# Patient Record
Sex: Male | Born: 1955 | Race: White | Hispanic: No | Marital: Single | Smoking: Former smoker
Health system: Southern US, Community
[De-identification: ages and names within clinical notes are randomized; demographics above are authoritative.]

## PROBLEM LIST (undated history)

## (undated) DIAGNOSIS — I2510 Atherosclerotic heart disease of native coronary artery without angina pectoris: Secondary | ICD-10-CM

## (undated) DIAGNOSIS — J449 Chronic obstructive pulmonary disease, unspecified: Secondary | ICD-10-CM

## (undated) DIAGNOSIS — J45909 Unspecified asthma, uncomplicated: Secondary | ICD-10-CM

## (undated) DIAGNOSIS — M1990 Unspecified osteoarthritis, unspecified site: Secondary | ICD-10-CM

## (undated) DIAGNOSIS — E785 Hyperlipidemia, unspecified: Secondary | ICD-10-CM

## (undated) DIAGNOSIS — G4733 Obstructive sleep apnea (adult) (pediatric): Secondary | ICD-10-CM

## (undated) DIAGNOSIS — G4730 Sleep apnea, unspecified: Secondary | ICD-10-CM

## (undated) DIAGNOSIS — I10 Essential (primary) hypertension: Secondary | ICD-10-CM

## (undated) DIAGNOSIS — F32A Depression, unspecified: Secondary | ICD-10-CM

## (undated) DIAGNOSIS — M10322 Gout due to renal impairment, left elbow: Secondary | ICD-10-CM

## (undated) HISTORY — DX: Chronic obstructive pulmonary disease, unspecified: J44.9

## (undated) HISTORY — DX: Sleep apnea, unspecified: G47.30

## (undated) HISTORY — DX: Essential (primary) hypertension: I10

## (undated) HISTORY — DX: Obstructive sleep apnea (adult) (pediatric): G47.33

## (undated) HISTORY — DX: Atherosclerotic heart disease of native coronary artery without angina pectoris: I25.10

## (undated) HISTORY — PX: NECK SURGERY: SHX720

## (undated) HISTORY — DX: Hyperlipidemia, unspecified: E78.5

## (undated) HISTORY — DX: Unspecified osteoarthritis, unspecified site: M19.90

## (undated) HISTORY — PX: SPINE SURGERY: SHX786

## (undated) HISTORY — DX: Morbid (severe) obesity due to excess calories: E66.01

---

## 2006-03-07 ENCOUNTER — Ambulatory Visit: Payer: Self-pay | Admitting: Cardiovascular Disease

## 2006-03-26 ENCOUNTER — Encounter: Admission: RE | Admit: 2006-03-26 | Discharge: 2006-03-26 | Payer: Self-pay | Admitting: Family Medicine

## 2006-04-13 ENCOUNTER — Ambulatory Visit: Payer: Self-pay | Admitting: Gastroenterology

## 2006-04-22 ENCOUNTER — Ambulatory Visit (HOSPITAL_COMMUNITY): Admission: RE | Admit: 2006-04-22 | Discharge: 2006-04-23 | Payer: Self-pay | Admitting: Neurosurgery

## 2006-09-07 ENCOUNTER — Encounter: Admission: RE | Admit: 2006-09-07 | Discharge: 2006-09-07 | Payer: Self-pay | Admitting: Family Medicine

## 2006-11-01 ENCOUNTER — Ambulatory Visit (HOSPITAL_COMMUNITY): Admission: RE | Admit: 2006-11-01 | Discharge: 2006-11-01 | Payer: Self-pay | Admitting: Neurosurgery

## 2006-11-16 ENCOUNTER — Inpatient Hospital Stay (HOSPITAL_COMMUNITY): Admission: RE | Admit: 2006-11-16 | Discharge: 2006-11-18 | Payer: Self-pay | Admitting: Neurosurgery

## 2007-03-26 ENCOUNTER — Encounter: Admission: RE | Admit: 2007-03-26 | Discharge: 2007-03-26 | Payer: Self-pay | Admitting: Neurosurgery

## 2007-10-12 ENCOUNTER — Emergency Department: Payer: Self-pay | Admitting: Unknown Physician Specialty

## 2008-02-09 ENCOUNTER — Ambulatory Visit: Payer: Self-pay | Admitting: Cardiovascular Disease

## 2008-02-12 ENCOUNTER — Encounter
Admission: RE | Admit: 2008-02-12 | Discharge: 2008-05-12 | Payer: Self-pay | Admitting: Physical Medicine & Rehabilitation

## 2008-02-13 ENCOUNTER — Ambulatory Visit: Payer: Self-pay | Admitting: Physical Medicine & Rehabilitation

## 2008-03-12 ENCOUNTER — Ambulatory Visit: Payer: Self-pay | Admitting: Physical Medicine & Rehabilitation

## 2008-04-04 ENCOUNTER — Ambulatory Visit: Payer: Self-pay | Admitting: Physical Medicine & Rehabilitation

## 2008-04-09 ENCOUNTER — Ambulatory Visit: Payer: Self-pay

## 2008-04-29 IMAGING — RF DG FLUORO RM 1-60 MIN
3 series · 3 of 3 positions shown · non-contrast
Comparison: MRI of 09/07/06.

CLINICAL DATA: Neck pain. 
FLUORO ROOM FOR LUMBAR PUNCTURE AND INJECTION:
TECHNIQUE: Multidetector CT imaging of the cervical spine was performed after intrathecal injection of contrast.  Multiplanar CT image reconstructions were also generated.
TECHNIQUE: Multidetector CT imaging of the lumbar spine was performed after intrathecal injection of contrast.  Multiplanar CT image reconstructions were also generated.

[Series 1: run · 1 of 1 slices shown (1 of 3)]
[im 1/1]
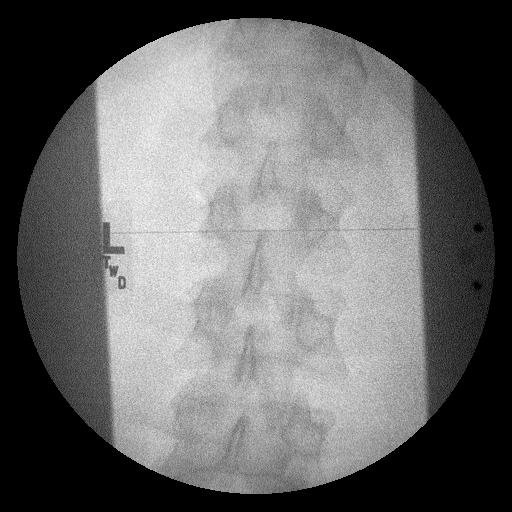

[Series 2: run · 1 of 1 slices shown (2 of 3)]
[im 1/1]
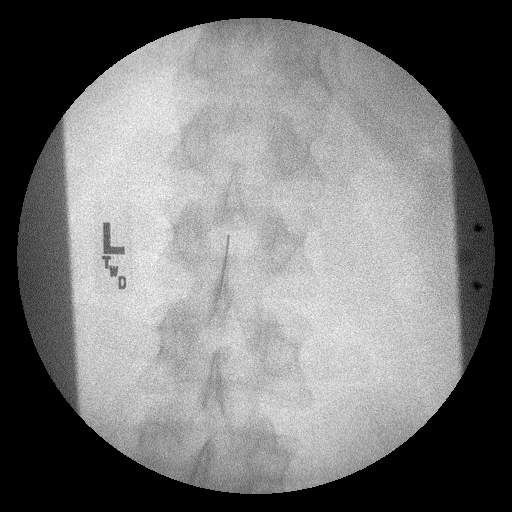

[Series 3: run · 1 of 1 slices shown (3 of 3)]
[im 1/1]
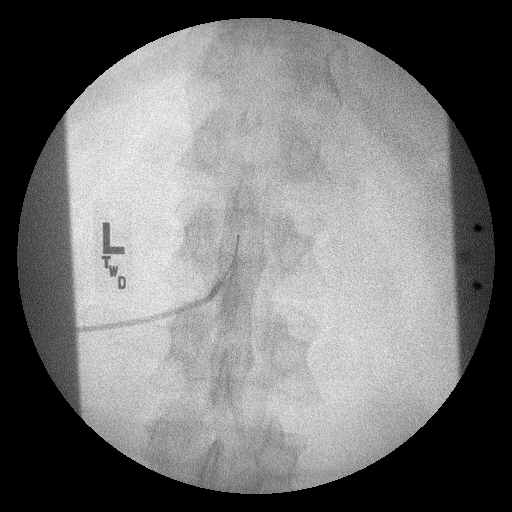

[3 of 3 positions shown; findings below may reference images not displayed]

FINDINGS: Lumbar puncture and injection of contrast was performed by Dr. Nezasitan.  Lumbar puncture was difficult.  AP and lateral views of the lumbar spine reveal a needle overlying the spinal canal at the L3-4 level.   There is a small amount of subarachnoid contrast noted in the lumbar canal.  The patient was over the weight limit for the table, therefore no further imaging was performed.
IMPRESSION: Subarachnoid injection of contrast for myelography.  No diagnostic myelographic images were obtained. 
CT CERVICAL SPINE WITH CONTRAST (POST-MYELOGRAM):
FINDINGS: There has been anterior plate and interbody fusion of C4-5 and C5-6, which appears to satisfactory in position and alignment.  The cervical alignment is normal. 
C2-3:  Small to medium central disk protrusion without cord deformity.
C3-4:  Small to moderate central disk protrusion with mild flattening of the ventral cord.  There is mild spinal stenosis with the canal measuring 9 mm in diameter.  Mild facet arthropathy is noted without significant foraminal narrowing. 
C4-5:  Anterior plate and screws are in good position.  There is a residual posterior osteophyte with some associated soft tissue density, which may be fibrosis in the midline.  The canal is narrowed to 8.6 mm.  There is flattening of the cord. 
C5-6:  Anterior plate and screws are in good position.  There is a moderate amount of posterior osteophyte above and below the disk space causing spinal stenosis.  The cord is compressed, as noted on the prior MRI.  The prior MRI revealed hyperintensity in the cord at this level due to myelomalacia.  There may also be some epidural fibrosis impinging on the cord. The canal measures approximately 6.5 mm in diameter.  Mild left foraminal narrowing due to spurring. 
C6-7:  Small central osteophyte is seen without cord deformity. 
C7-T1:  Negative. 
Image quality is degraded due to patient size.
IMPRESSION: 1.  Anterior plate and interbody fusion at C4-5 and C5-6.  There remains spinal stenosis at these levels, especially at C5-6, due to posterior osteophytes and some soft tissue thickening, felt to be fibrosis.  There is some cord compression at C5-6 and myelomalacia is noted in the cord on the prior MRI. 
2.  Small to moderate central disk protrusion at C2-3 and C3-4 contribute to mild spinal stenosis at these levels. 
CT LUMBAR SPINE WITH CONTRAST (POST-MYELOGRAM):
FINDINGS: Image quality is significantly degraded by obesity.  The images are very grainy. 
There is mild anterior slip of L4 on L5 of approximately 5 mm.  No fracture or mass is seen.  The conus medullaris is normal. 
T12-L1:  Mild disk degeneration. 
L1-2:  Mild disk bulging and mild facet and ligamentum flavum hypertrophy.  There is mild central canal stenosis. 
L2-3:  Mild disk bulging and mild facet and ligamentum flavum hypertrophy.  There is mild to moderate central canal stenosis. 
L3-4:  There is diffuse bulging of the disk.  Facet and ligamentum flavum hypertrophy are present.  There is moderate central canal stenosis. 
L4-5:  There are 5 mm of anterior slip.  There is moderate to advanced facet arthropathy.  There is disk bulging.  There is moderate to severe central canal stenosis.  There is mild foraminal narrowing bilaterally. 
L5-S1:  Mild disk bulging and mild facet arthropathy.
IMPRESSION: 1.  Image quality is degraded by patient size. 
2.  The patient has a relative congenital spinal stenosis in the lumbar spine.  In addition, multiple levels of spinal stenosis are present.  Spinal stenosis is most severe at L3-4 and L4-5.  There is grade I anterior slip of L4 on L5 due to facet and disk degeneration.

## 2008-05-02 ENCOUNTER — Encounter
Admission: RE | Admit: 2008-05-02 | Discharge: 2008-05-03 | Payer: Self-pay | Admitting: Physical Medicine & Rehabilitation

## 2008-05-03 ENCOUNTER — Ambulatory Visit: Payer: Self-pay | Admitting: Physical Medicine & Rehabilitation

## 2008-05-14 IMAGING — RF DG CERVICAL SPINE 1V
1 series · 1 of 1 positions shown · non-contrast
Comparison: none

CLINICAL DATA: Cervical posterior fusion.  
 CERVICAL SPINE ? 1 VIEW:

[Series 1: run · 1 of 1 slices shown]
[im 1/1]
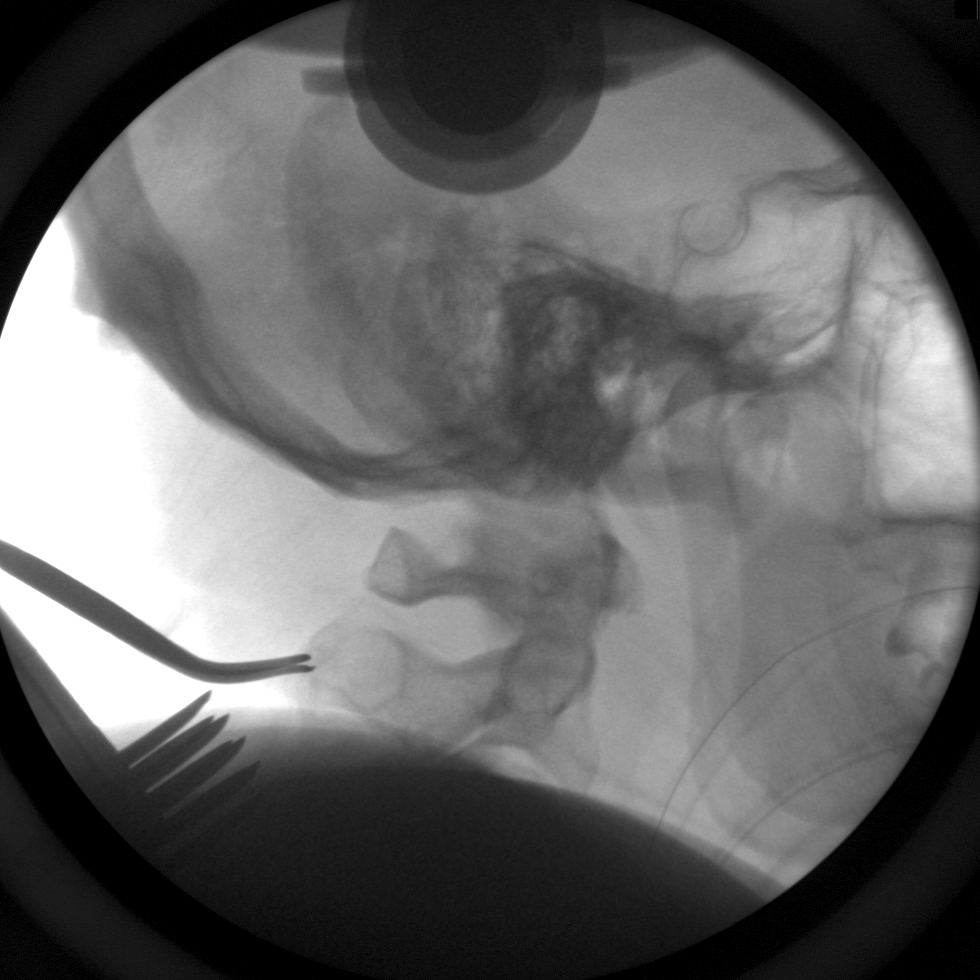

[1 of 1 positions shown; findings below may reference images not displayed]

FINDINGS: A single intraoperative fluoroscopic spot view of the upper cervical spine is submitted.  Surgical probe tip projects posterior to the C2 spinous process.
IMPRESSION: Please see above.

## 2008-08-02 ENCOUNTER — Encounter
Admission: RE | Admit: 2008-08-02 | Discharge: 2008-08-15 | Payer: Self-pay | Admitting: Physical Medicine & Rehabilitation

## 2008-08-15 ENCOUNTER — Ambulatory Visit: Payer: Self-pay | Admitting: Physical Medicine & Rehabilitation

## 2008-08-19 ENCOUNTER — Ambulatory Visit: Payer: Self-pay | Admitting: Cardiovascular Disease

## 2008-10-22 ENCOUNTER — Ambulatory Visit: Payer: Self-pay | Admitting: Physical Medicine & Rehabilitation

## 2008-10-22 ENCOUNTER — Encounter
Admission: RE | Admit: 2008-10-22 | Discharge: 2008-12-31 | Payer: Self-pay | Admitting: Physical Medicine & Rehabilitation

## 2008-10-31 ENCOUNTER — Ambulatory Visit: Payer: Self-pay | Admitting: Physical Medicine & Rehabilitation

## 2008-12-31 ENCOUNTER — Ambulatory Visit: Payer: Self-pay | Admitting: Physical Medicine & Rehabilitation

## 2009-02-20 ENCOUNTER — Ambulatory Visit: Payer: Self-pay | Admitting: Physical Medicine & Rehabilitation

## 2009-02-20 ENCOUNTER — Encounter
Admission: RE | Admit: 2009-02-20 | Discharge: 2009-02-20 | Payer: Self-pay | Admitting: Physical Medicine & Rehabilitation

## 2009-04-09 DIAGNOSIS — I10 Essential (primary) hypertension: Secondary | ICD-10-CM | POA: Insufficient documentation

## 2009-04-09 DIAGNOSIS — E785 Hyperlipidemia, unspecified: Secondary | ICD-10-CM | POA: Insufficient documentation

## 2009-04-09 DIAGNOSIS — I2510 Atherosclerotic heart disease of native coronary artery without angina pectoris: Secondary | ICD-10-CM | POA: Insufficient documentation

## 2009-04-14 ENCOUNTER — Ambulatory Visit: Payer: Medicare Other | Admitting: Gastroenterology

## 2009-05-09 ENCOUNTER — Encounter
Admission: RE | Admit: 2009-05-09 | Discharge: 2009-06-11 | Payer: Self-pay | Admitting: Physical Medicine & Rehabilitation

## 2009-05-12 ENCOUNTER — Ambulatory Visit: Payer: Self-pay | Admitting: Physical Medicine & Rehabilitation

## 2009-05-19 ENCOUNTER — Ambulatory Visit: Payer: Self-pay | Admitting: Physical Medicine & Rehabilitation

## 2009-07-18 ENCOUNTER — Encounter
Admission: RE | Admit: 2009-07-18 | Discharge: 2009-07-21 | Payer: Self-pay | Admitting: Physical Medicine & Rehabilitation

## 2009-07-21 ENCOUNTER — Ambulatory Visit: Payer: Self-pay | Admitting: Physical Medicine & Rehabilitation

## 2009-10-14 ENCOUNTER — Encounter
Admission: RE | Admit: 2009-10-14 | Discharge: 2009-10-16 | Payer: Self-pay | Admitting: Physical Medicine & Rehabilitation

## 2009-10-16 ENCOUNTER — Ambulatory Visit: Payer: Self-pay | Admitting: Physical Medicine & Rehabilitation

## 2009-12-31 ENCOUNTER — Encounter
Admission: RE | Admit: 2009-12-31 | Discharge: 2010-01-08 | Payer: Self-pay | Admitting: Physical Medicine & Rehabilitation

## 2010-01-08 ENCOUNTER — Ambulatory Visit: Payer: Self-pay | Admitting: Physical Medicine & Rehabilitation

## 2010-04-03 ENCOUNTER — Encounter
Admission: RE | Admit: 2010-04-03 | Discharge: 2010-07-02 | Payer: Self-pay | Source: Home / Self Care | Attending: Physical Medicine & Rehabilitation | Admitting: Physical Medicine & Rehabilitation

## 2010-04-09 ENCOUNTER — Ambulatory Visit: Payer: Self-pay | Admitting: Physical Medicine & Rehabilitation

## 2010-07-02 ENCOUNTER — Encounter
Admission: RE | Admit: 2010-07-02 | Discharge: 2010-07-21 | Payer: Self-pay | Source: Home / Self Care | Attending: Physical Medicine & Rehabilitation | Admitting: Physical Medicine & Rehabilitation

## 2010-07-06 ENCOUNTER — Ambulatory Visit: Admit: 2010-07-06 | Payer: Self-pay | Admitting: Physical Medicine & Rehabilitation

## 2010-07-12 ENCOUNTER — Encounter: Payer: Self-pay | Admitting: Neurosurgery

## 2010-07-23 ENCOUNTER — Ambulatory Visit: Payer: Medicare Other

## 2010-07-23 ENCOUNTER — Ambulatory Visit: Admit: 2010-07-23 | Payer: Self-pay | Admitting: Physical Medicine & Rehabilitation

## 2010-07-23 ENCOUNTER — Encounter: Payer: Medicare Other | Admitting: Physical Medicine & Rehabilitation

## 2010-07-23 ENCOUNTER — Encounter: Payer: Medicare Other | Attending: Physical Medicine & Rehabilitation

## 2010-07-23 DIAGNOSIS — M545 Low back pain, unspecified: Secondary | ICD-10-CM | POA: Insufficient documentation

## 2010-07-23 DIAGNOSIS — IMO0002 Reserved for concepts with insufficient information to code with codable children: Secondary | ICD-10-CM | POA: Insufficient documentation

## 2010-10-26 ENCOUNTER — Encounter: Payer: Medicare Other | Attending: Physical Medicine & Rehabilitation

## 2010-10-26 ENCOUNTER — Encounter: Payer: Medicare Other | Admitting: Physical Medicine & Rehabilitation

## 2010-10-26 DIAGNOSIS — IMO0002 Reserved for concepts with insufficient information to code with codable children: Secondary | ICD-10-CM | POA: Insufficient documentation

## 2010-10-26 DIAGNOSIS — M545 Low back pain, unspecified: Secondary | ICD-10-CM | POA: Insufficient documentation

## 2010-10-26 NOTE — Procedures (Signed)
NAMEVALLIE, TETERS                 ACCOUNT NO.:  [id number]  MEDICAL RECORD NO.:  [id number]           PATIENT TYPE:  O  LOCATION:  [facility]                          FACILITY:  [facility]  PHYSICIAN:  Erick Colace, M.D.DATE OF BIRTH:  23-Jun-1955  DATE OF PROCEDURE: DATE OF DISCHARGE:                              OPERATIVE REPORT  PROCEDURE:  This is a left L3-4 translaminar lumbar epidural steroid injection under fluoroscopic guidance.  INDICATION:  Lumbar stenosis with radiculitis.  Pain is only partially responsive to medication management, other conservative care has received about 3 months on average with each epidural steroid injection performed, last one performed July 23, 2010.  Pain is limiting function, Oswestry score of 66%.  Numeric rating scale is 7-8/10 pain.  Informed consent was obtained after describing risks and benefits of the procedure with the patient.  These include bleeding, bruising, and infection, he elects proceed and has given written consent.  The patient placed prone on fluoroscopy table with Betadine prep, sterile drape, 25- gauge inch and half needle was used to anesthetize the skin and subcu tissue with 1% lidocaine x2 mL.  Then a 25-gauge inch and half needle was used to anesthetize skin and subcu tissue with 1.5 mL of 1% lidocaine followed by insertion of 17-gauge 8-cm spinal needle with stylet in place.  Once needle tip approximated posterior elements on lateral imaging, loss-of-resistance technique was used with 50:50 air saline mix.  Positive loss-of-resistance was confirmed with Omnipaque 180 x 3 mL demonstrated good epidural spread followed by injection of 2 mL of 40 mg/mL of Depo-Medrol and 2 mL of 1% MPF lidocaine.  The patient tolerated procedure well.  Postprocedure instructions given.  Return in 3 months, possible reinjection at that time.     Erick Colace, M.D. Electronically Signed    AEK/MEDQ  D:  10/26/2010 10:01:16  T:   10/26/2010 22:13:15  Job:  [id number]

## 2010-11-03 NOTE — Assessment & Plan Note (Signed)
Gary Trevino returns today.  He had missed an appointment couple of weeks  ago.  This is a 55 year old male with lumbosacral radiculitis.  His past  history of cervical fusion and chronic low back pain.  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  He has been  relatively well maintained on tramadol 100 mg t.i.d.  He has not had any  improvement on higher dose of Effexor on the 3 tablets per day, compared  with 2 tablets per day.  He has had no evidence of serotonin syndrome.   My concern today is some burning pain in the hands.  He has a chronic  ulnar neuropathy as well as cervical post laminectomy syndrome.   CURRENT MEDICATIONS:  1. Tramadol 100 mg t.i.d.  2. Effexor 37.5 t.i.d., but we will plan on reducing back on b.i.d.   REVIEW OF SYSTEMS:  Numbness, tremor, tingling, trouble walking, spasms,  depression, confusion, anxiety, constipation.   PAST MEDICAL HISTORY:  Hypertension.   SOCIAL HISTORY:  Single.  Lives alone.   PHYSICAL EXAMINATION:  Blood pressure 113/62, pulse 66, respirations 18,  O2 sat 96% on room air.  Obese male in no acute stress.  Orientation x3.  Affect is alert.  Gait is normal and his upper and lower extremity  strength is normal with exception.  He does not have a good grip with  the medial 3 fingers of his left hand.   Sensation, reduced in the bilateral ulnar distribution.   The upper and lower extremity range of motion is only mildly reduced at  the end range in the hips internal-external rotation.   IMPRESSION:  1. Lumbar spinal stenosis.  2. Cervical stenosis.  3. Question ulnar neuropathy versus C8 radiculitis.   PLAN:  1. We will continue tramadol 100 t.i.d.  2. We will reduce Effexor to 37.5 b.i.d.  3. Add gabapentin 100 mg nightly x3 days, work up b.i.d. x3 days,      t.i.d. x3 days, then q.i.d.   I will see him back in couple of months.  If his upper extremity  symptomatology progressive despite his  gabapentin, we will increase the  gabapentin and do nerve study.      Gary Trevino, M.D.  Electronically Signed    AEK/MedQ  D:  08/15/2008 16:09:17  T:  08/16/2008 03:54:02  Job #:  [id number]

## 2010-11-03 NOTE — Procedures (Signed)
Gary Trevino, SHANKAR                 ACCOUNT NO.:  [id number]   MEDICAL RECORD NO.:  [id number]         PATIENT TYPE:  aecp   LOCATION:                                 FACILITY:   PHYSICIAN:  Erick Colace, M.D.DATE OF BIRTH:  [birth year]   DATE OF PROCEDURE:  DATE OF DISCHARGE:                               OPERATIVE REPORT   INDICATION:  Lumbosacral radiculitis.   PROCEDURE:  L3-4 translaminar lumbar epidural steroid injection under  fluoroscopic guidance pain is only partially responsive to medication  management and other conservative care and interferes with self-care and  mobility.   Informed consent was obtained after describing risks and benefits of the  procedure with the patient.  These include bleeding, bruising,  infection.  He elects to proceed and has given written consent.  The  patient was placed prone on fluoroscopy table.  Betadine prep, sterile  drape.  A 25-gauge 1-1/2 inch needle was used to anesthetize skin and  subcu tissue, 1% lidocaine x2 mL, then a 22-gauge 3-1/2 inch spinal  needle was inserted under fluoroscopic guidance targeting the L3-4  interlaminar space.  AP, lateral, and oblique images utilized.  Omnipaque 180 under live fluoro demonstrated good epidural spread  followed by injection of 2 mL of 40 mg/mL Depo-Medrol and 2 mL of 1% MPF  lidocaine.  The patient tolerated the procedure well.  Pre and post  injection vitals stable.  He has been off his Plavix for 5 days.      Erick Colace, M.D.  Electronically Signed     AEK/MEDQ  D:  04/04/2008 13:46:11  T:  04/05/2008 02:01:15  Job:  [id number]

## 2010-11-03 NOTE — Op Note (Signed)
Gary Trevino, Gary Trevino                 ACCOUNT NO.:  [id number]   MEDICAL RECORD NO.:  [id number]          PATIENT TYPE:  INP   LOCATION:  5016                         FACILITY:  [facility]   PHYSICIAN:  Coletta Memos, M.D.     DATE OF BIRTH:  1955/08/10   DATE OF PROCEDURE:  11/16/2006  DATE OF DISCHARGE:                               OPERATIVE REPORT   PREOPERATIVE DIAGNOSIS:  1. Cervical stenosis C4 to C6.  2. Cervical myelopathy.  3. Possible pseudoarthrosis C4 to C6.   POSTOPERATIVE DIAGNOSES:  Cervical stenosis with myelopathy C3 to C6   PROCEDURE:  1. Posterior cervical decompression C4 to C6 via laminectomy.  2. Posterior arthrodesis C4 to C6 with morselized autograft, same      incision.  3. Posterior instrumentation C3 to C6 with Vertex lateral mass screws      connected by rods.   FINDINGS:  No pseudoarthrosis identified.   INDICATIONS FOR PROCEDURE:  Gary Trevino is a gentleman whom I fused  approximately six months ago and did an anterior cervical decompression  at C4-C5 and at C5-C6 secondary to abnormal cord signal and evidence of  myelopathy on exam.  Postoperatively, he improved, but remained with  residual pain in the neck and some in his right arm and hand.  I had an  MRI performed that showed that he still did have compression of the  spinal cord at the level of the previous fusions.  I also did a CT after  a myelogram and it was not clear at all whether he had formed a solid  fusion in the six months.  PEEK interbodies had been used previously.  I, therefore, recommended to him secondary to the fact that he still has  some compression, that I do a posterior decompression and since there  was question of possible pseudoarthrosis to also place posterior  instrumentation.  He agreed and is admitted today for that operation.   SURGEON:  Coletta Memos, M.D.   ASSISTANT:  Danae Orleans. Venetia Maxon, M.D.   ANESTHESIA:  General endotracheal.   OPERATIVE NOTE:  [name] was  brought to the operating room, intubated,  and placed under general anesthetic without difficulty.  He had a Foley  catheter placed under sterile conditions.  A Mayfield head holder was  placed at approximately 60 pounds of pressure.  He was then rolled prone  on the operating table with body rolls so that all pressure points were  properly padded.  He was connected to the bed via the Mayfield adapter.  His neck was prepped and he was draped in a sterile fashion.  I used 10  mL of 0.5% lidocaine with 1:200,000 epinephrine and I infiltrated that  into the cervical region posteriorly.  I opened the skin with a #10  blade.  I took this down through the subcutaneous fat.  I placed a self  retaining retractor.  Then, using monopolar cautery, continued to  dissect to the posterior cervical fascia.  I was then able to expose the  lamina of two vertebra.  Fluoroscopy showed that I could not  see where I  was at that point in time.  So I then opened rostrally to this level  until I was able to place a towel clip on C2.  I was able to clearly see  that on a fluoroscopic image.  I then counted down from C2 and  identified C4, C5 and C6 lamina.  I then exposed the lateral masses  bilaterally.   With Dr. Fredrich Birks help, we then placed posterior instrumentation from C4  to C6.  Each hole was first determined by using an awl in the lateral  mass of C4, C5 and C6.  Then, using a power drill, 12 mm length 3.5 mm  in diameter, a hole was drilled into the lateral mass 30 degrees and 30  degrees up in each instance.  We stayed out the contralateral side.  Screws were placed in the lateral mass of C4, C5 and C6 bilaterally.  After placing the screws, I then turned my attention to the  decompression.   The C4, C5 and C6 lamina were removed using Leksell rongeur and Kerrison  punches thin foot plated.  This was done to fully decompress the spinal  cord from C4 to C6.  Ligamentum flavum was also removed.   I  then turned my attention to the posterior arthrodesis.  The facet  joints between C4 and C5, C5 and C6, were decorticated bilaterally using  a high speed drill and curets.  I then placed morselized autograft taken  from the laminectomy and placed that across the facets at C4 and C5 and  at C5 and C6 bilaterally.  I then prepared to close the wound.   I closed the wound in a layered fashion using Vicryl sutures.  A 3-0  nylon suture was used to reapproximate the skin edges.  A sterile  dressing was applied.  I then removed the patient from the Mayfield  adapter and we turned him supine onto the bed.  He was then extubated  moving all extremities.           ______________________________  Coletta Memos, M.D.     KC/MEDQ  D:  11/16/2006  T:  11/16/2006  Job:  [id number]

## 2010-11-03 NOTE — Assessment & Plan Note (Signed)
A 55 year old male with a history of lumbosacral radiculitis.  He has  past history of cervical fusion and chronic low back pain.  He has a  history of alcohol abuse with prior DUI and severe COPD.  I saw him on  initial consultation on January 17, 2008.  He has been relatively well  maintained on tramadol 100 mg t.i.d. and switched him from his  benzodiazepine to Effexor 37.5 b.i.d.  He has had no evidence of  serotonin syndrome.  He feels like the Effexor has been helping somewhat  with the anxiety, but feels like he can use a little bit more of that.   He has had some relief of his low back and lower extremity pain with the  L3-4 translaminar lumbar epidural steroid injection, confirmed April 04, 2008.  He has continued relief.  His average pain is 5/10.  Sleep is  poor.  He can walk 30 minutes at a time.  He does not climb steps.  He  does not drive.  He needs assistance with certain household duties as  well as shopping, meal preparation bathing and dressing.  He has  numbness and tingling in the feet as well as confusion, depression, and  anxiety, but negative for suicidal thoughts.  He does have some  constipation.  His Oswestry disability index score is 42%, which is  somewhat improved compared to 50% on March 12, 2008.   CURRENT MEDICATIONS:  1. Tramadol 100 mg t.i.d.  2. Effexor 37.5 b.i.d.   PHYSICAL EXAMINATION:  VITAL SIGNS:  Blood pressure 113/69, pulse 72,  respiratory rate 18, and O2 saturation is 94% on room air.  GENERAL:  In no acute distress. Orientation x3.  Affect is alert.  Gait  is normal.  Coordination is normal.  Upper and lower extremities, deep  tendon reflexes are 2+ bilateral ankles, intact bilateral knees.  He has  full strength in upper and lower extremities.  Extremity show no  evidence of edema.   IMPRESSION:  1. Lumbar radiculitis improved after lumbar epidural steroid      injection.  2. Lumbar spondylosis with spinal stenosis as well as  cervical post-      laminectomy syndrome.  He is doing relatively well on the current      medications.  We will bump up his Effexor to 37.5 one in the      morning and 2 in the evening.  Discussed risk of serotonin      syndrome.  He understands the signs and symptoms and will report      them to [place] should he start experiencing with increased dose of the      Effexor in combination with the tramadol.      Erick Colace, M.D.  Electronically Signed     AEK/MedQ  D:  05/03/2008 16:08:25  T:  05/04/2008 03:33:52  Job #:  [id number]   cc:   Galen Daft. Timoteo Gaul, M.D.

## 2010-11-03 NOTE — Procedures (Signed)
NAMEMESHACH, Gary Trevino                 ACCOUNT NO.:  [id number]   MEDICAL RECORD NO.:  [id number]          PATIENT TYPE:  REC   LOCATION:  [facility]                          FACILITY:  [facility]   PHYSICIAN:  Erick Colace, M.D.DATE OF BIRTH:  1955-07-20   DATE OF PROCEDURE:  02/20/2009  DATE OF DISCHARGE:  12/31/2008                               OPERATIVE REPORT   PROCEDURE:  This is a L3-4 translaminar lumbar epidural steroid  injection under fluoroscopic guidance.   INDICATIONS:  Lumbar radiculitis and pain interferes with activity and  persists despite conservative care.  He has had good results with prior  epidural injections, last performed in May 2010 with duration of effect  about 3 months.   Informed consent was obtained after describing risks and benefits of the  procedure with the patient.  These include bleeding, bruising, and  infection.  He elects to proceed and has given written consent.   The patient was placed prone on fluoroscopy table.  Betadine prep,  sterile drape, 25-gauge 1-1/2-inch needle was used to anesthetize the  skin and subcutaneous tissue, 1% lidocaine x2 mL.  Then, 18-gauge Tuohy  needle was inserted under fluoroscopic guidance into the left L3-4  interlaminar space.  AP and lateral oblique images were utilized, loss-  of-resistance technique utilized with 50:50 air-saline mix advancing  under lateral views.  Then, Omnipaque 180 under live fluoro x 2 mL  demonstrated good epidural spread followed by injection of 2 mL of 40  mg/mL Depo-Medrol and 2 mL of 1% lidocaine.  The patient tolerated the  procedure well.  Pre- and post-injection vitals stable.  Post injection  instructions given.  I will see him back in about 3 months for  reinjection.      Erick Colace, M.D.  Electronically Signed     AEK/MEDQ  D:  02/20/2009 10:19:19  T:  02/20/2009 23:27:16  Job:  [id number]
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
## 2010-11-03 NOTE — Assessment & Plan Note (Signed)
[facility] OFFICE NOTE   Gary Trevino, Gary Trevino                        MRN:          [id number]  DATE:08/19/2008                            DOB:          02-19-1956    PRIMARY CARE PHYSICIAN:  Dr. Gelene Mink at [facility].   HISTORY OF PRESENT ILLNESS:  [name] is a pleasant 55 year old  morbidly obese Caucasian male with past medical history significant for  nonobstructive coronary artery disease, hypertension, hyperlipidemia,  obstructive sleep apnea, COPD, and history of tobacco abuse, who returns  today for routine followup appointment.  [name] was seen initially in  our office on February 09, 2008, with complaints of shortness of breath  and for further evaluation after an abnormal EKG and abnormal  echocardiogram in his primary care physician's office.  His  echocardiogram was read by Dr. Kirke Corin on January 08, 2008, that showed  mildly dilated left atrium with other chambers being normal in size.  The left ventricular systolic function was noted to be normal.  There  was mild-to-moderate left ventricular hypertrophy.  There was trace  tricuspid regurgitation, but no other significant valvular abnormalities  were noted.  Ejection fraction was estimated at 59%.  His EKG during the  visit in our office showed sinus bradycardia, but no significant  changes.   The patient returns today for routine cardiac visit.  He had complained  of some lower extremity pain with ambulation at the last visit.  We  performed lower extremity arterial Dopplers that showed no evidence of  occlusive arterial disease in the bilateral lower extremities.  The  patient tells me that he has had 2 episodes of mild throbbing-type  sensation in his left chest that lasts for a few minutes.  This has  occurred twice over the last 6 months.  There are no associated symptoms  and no radiation of the pain.  He tells me that he has  been active  around his house and was chopping wood prior to his appointment this  morning.  He denies having any heaviness in his chest or any episodes of  dizziness, near syncope, or syncope.  He occasionally has some shortness  of breath which he feels is most likely related to his COPD.  The  shortness of breath generally resolves when he uses his inhalers.  He  has had no changes in his medications since last visit in our office.   CURRENT MEDICATIONS:  1. Phentermine 37.5 mg every other day.  2. Micardis 40/12.5 mg once daily.  3. Plavix 75 mg once daily.  4. Clotrimazole cream every other day.  5. Nystop 100,000 twice daily.  6. Lipitor 10 mg once daily.  7. Lovaza 1 g 2 tablets once daily.  8. Lyrica 150 mg twice daily.  9. Skelaxin 800 mg at night.  10.The patient also uses p.r.n. inhalers including Proventil, Spiriva,      and Azmacort.  11.Aspirin 81 mg once daily.   REVIEW OF SYSTEMS:  As stated in the history of present illness and is  otherwise negative.   PHYSICAL EXAMINATION:  VITAL SIGNS:  Blood pressure 110/80, pulse 63 and  regular, respirations 12 and unlabored.  GENERAL:  He is a pleasant, overweight Caucasian male in no acute  distress.  He is alert and oriented x3.  NECK:  No JVD.  No carotid bruits noted.  No thyromegaly.  No  lymphadenopathy.  HEENT:  Normal.  SKIN:  Warm and dry.  MUSCULOSKELETAL:  Muscle strength and tone is normal.  PSYCHIATRIC:  Mood and affect is appropriate.  NEUROLOGIC:  No focal neurological deficits.  LUNGS:  Clear to auscultation bilaterally with no wheezes, rhonchi, or  crackles noted.  CARDIOVASCULAR:  Regular rate and rhythm without murmurs, gallops, or  rubs noted.  ABDOMEN:  Obese, soft, bowel sounds are present.  EXTREMITIES:  There is no evidence of lower extremity edema.  Pulses are  2+ in the bilateral dorsalis pedis arteries.  Pulses are difficult to  palpate in the posterior tibial arteries.   DIAGNOSTIC  STUDIES:  1. A 12-lead EKG obtained in our office today shows normal sinus      rhythm with a ventricular rate of 63 beats per minute.  The      corrected QT interval is 427 milliseconds.  The PR interval is 198      milliseconds.  QRS duration is 114 milliseconds.  The axis is      normal.  2. Bilateral lower extremity arterial Dopplers show brisk and      triphasic wave forms in the bilateral common femoral, popliteal,      anterior and posterior tibial arteries.  There is no evidence of      segmental lower extremity arterial disease bilaterally.  The ankle-      brachial index is normal bilaterally.  ABI was 1.1 on the right and      1.2 on the left.   ASSESSMENT AND PLAN:  This is a pleasant 55 year old Caucasian male with  a history of morbid obesity, COPD, obstructive sleep apnea,  nonobstructive coronary artery disease, hypertension, hyperlipidemia,  and heavy tobacco use, not currently smoking, who presents for a routine  cardiac followup.  The patient is known to have mild nonobstructive  coronary artery disease with left heart catheterization performed in  September 2007 showing a 30% stenosis in the mid LAD, 30% stenosis in  the ramus intermedius, 30% stenosis in the proximal right coronary  artery, and a 30% stenosis in the mid right coronary artery.  He is not  currently having any signs or symptoms that is suggestive of congestive  heart failure, angina, or arrhythmias.  I would like to continue his  current medications.  I am not completely sure why the patient has been  on Plavix.  It sounds like this was started after his heart  catheterization.  He is not interested in stopping this medication at  this time.  I will have further discussions about potentially stopping  his Plavix at his next visit in our office.  His echocardiogram did show  mild-to-moderate left ventricular hypertrophy.  Because of this, we will  repeat an echocardiogram in 6 months.  We will see him  back in our  office following the echocardiogram.  The patient tells me that his  cholesterol was followed in the office of Dr. Timoteo Gaul.  I will make no  changes in his Lipitor  at the current time.  I have encouraged the patient to continue to  follow up with Dr. Timoteo Gaul for  all of his primary care needs.     Verne Carrow, MD  Electronically Signed    CM/MedQ  DD: 08/19/2008  DT: 08/20/2008  Job #: [id number]   cc:   Dr. Gelene Mink

## 2010-11-03 NOTE — Procedures (Signed)
NAMECLEDIS, SOHN                 ACCOUNT NO.:  [id number]   MEDICAL RECORD NO.:  [id number]          PATIENT TYPE:  REC   LOCATION:  [facility]                          FACILITY:  [facility]   PHYSICIAN:  Erick Colace, M.D.DATE OF BIRTH:  February 12, 1956   DATE OF PROCEDURE:  10/31/2008  DATE OF DISCHARGE:                               OPERATIVE REPORT   PROCEDURE:  This is a left paramedian L3-4 translaminar lumbar epidural  steroid injection under fluoroscopic guidance.   INDICATIONS:  Lumbar radiculopathy due to lumbar spinal stenosis.   Informed consent was obtained after describing risks and benefits of the  procedure with the patient.  These include bleeding, bruising,  infection, loss of bowel and bladder function, temporary or permanent  paralysis.  He elects to proceed and has given written consent.  His  pain persisted despite medication management, other conservative care,  and interferes with self-care mobility.   The patient placed prone on fluoroscopy table.  Betadine prep, sterile  drape 25-gauge 1-1/2 inch needle was used to anesthetize the skin and  subcutaneous tissue, 1% lidocaine x2 mL.  Then, an 18-gauge Tuohy needle  was inserted under fluoroscopic guidance starting in the L3-4  interlaminar space.  AP and lateral images were utilized, loss-of-  resistance technique with 50% air-saline mixture was utilized.  Positive  loss of resistance obtained, confirmed with Omnipaque 180 x2 mL under  live fluoro.  Then, demonstrating good epidural spread followed by  injection of  2 mL of 40 mg/mL Depo-Medrol and 2 mL of 1% MPF lidocaine.  The patient tolerated the procedure well.  Post injection instructions  given.      Erick Colace, M.D.  Electronically Signed     AEK/MEDQ  D:  10/31/2008 14:23:02  T:  11/01/2008 01:27:42  Job:  [id number]

## 2010-11-03 NOTE — Assessment & Plan Note (Signed)
[name] follows up.  He had a left paramedian L3-4 translaminar lumbar  epidural steroid injection under fluoroscopic guidance, Oct 31, 2008.  He has had good results with this.  He feels like he is may be starting  to get a little bit stiff, but overall has had good result.   He has had no new medical problems in the interval time.   REVIEW OF SYSTEMS:  As per health and history form.  No new findings.  He does have depression, anxiety, but no suicidal thoughts.   His pain level is in the 6-7/10 range.  His sleep is fair.  He lives  alone, divorced.   PHYSICAL EXAMINATION:  VITAL SIGNS:  Blood pressure 125/79, pulse 75,  respiratory rate 18, and O2 sat 96% on room air.  GENERAL:  No acute distress.  Mood and affect appropriate.  His gait is  normal.  His strength is normal in the upper and lower extremities.  Neck range of motion is mildly diminished.  Lumbar spine range of motion  is forward flexion 75%, extension is 25%.   IMPRESSION:  Lumbar spinal stenosis with intermittent radicular  symptoms.  These have improved after his injection.   PLAN:  We will repeat injection two more months.  We will continue  current meds, which include tramadol 2 p.o. q.i.d. as well as Effexor  37.5 b.i.d.  No signs of serotonin syndrome.      Gary Trevino, M.D.  Electronically Signed     AEK/MedQ  D:  12/31/2008 09:26:54  T:  01/01/2009 00:47:11  Job #:  [id number]

## 2010-11-03 NOTE — Assessment & Plan Note (Signed)
A 55 year old male with lumbosacral radiculitis; history of fusion;  chronic low back pain; alcohol abuse, DUI; and COPD.  He has been well  maintained on tramadol 100 t.i.d.; however, his epidural seems to have  worn off and he has been having increased pain in the back and lower  extremities.  He has had no new medical problems in the interval time.  He has come off his Plavix because his primary care physician did not  feel like he needed to be on this in addition to the baby aspirin.   CURRENT MEDICATIONS:  1. Tramadol 100 mg t.i.d.  2. Effexor 37.5 t.i.d.  3. Gabapentin 100 q.i.d.   REVIEW OF SYSTEMS:  Numbness, tremor, tingling, confusion, depression,  anxiety, spasm, dizziness, coughing, breathing problems, and  constipation.   Oswestry score 52%.   PHYSICAL EXAMINATION:  General, obese male in no acute distress.  Orientation x3.  Affect alert.  Blood pressure 139/74, pulse 68,  respirations 18, O2 sats 97% on room air.  The lower extremity strength  is full.  Deep tendon reflexes are 1+ at the ankles and 2+ at the knees.  Lower extremity range of motion is normal with exception to the end  range hip, internal and external rotation.   IMPRESSION:  1. Lumbar spinal stenosis.  2. Cervical stenosis.  3. Lumbar radiculopathy.   PLAN:  1. We will up his tramadol to 100 q.i.d. should be able to reduce it      again after his epidural.  2. We will continue Effexor 37.5 b.i.d. and continue gabapentin 100      q.i.d.      Erick Colace, M.D.  Electronically Signed     AEK/MedQ  D:  10/22/2008 09:49:25  T:  10/22/2008 22:50:07  Job #:  [id number]

## 2010-11-03 NOTE — Assessment & Plan Note (Signed)
A 55 year old male with cervical stenosis status post ACDF C4 through C6  and then posterior fusion same levels.  He has a history of COPD.  He  has a history of chronic narcotic analgesic usage with hypogonadism.  He  has been on Plavix, although I do not see a diagnosis on that.   His pain level currently is 7/10.  Pain interferes with dressing,  household duties, and shopping.   REVIEW OF SYSTEMS:  Positive for numbness and tingling in the hands,  trouble walking, spasms, dizziness, confusion, anxiety, shortness of  breath, and constipation.   CURRENT MEDICATIONS INCLUDE:  1. Tramadol 1 p.o. t.i.d.  2. Voltaren gel.  He does not think tramadol is helping him too much,      as this dose helped some.  3. Diazepam has been weaned.   Urine drug screen reviewed was consistent.   PHYSICAL EXAMINATION:  VITAL SIGNS:  Blood pressure 108/59, pulse 54,  respiratory rate is 18, and O2 sat 99% on room air.   Oswestry disability scale is 50%.   GENERAL:  Obese male in no acute distress.  Orientation x3.  Affect is  flat.  Upper and lower extremity strength is normal.  Neck range of  motion is 50% forward flexion, extension, lateral rotation, and bending.  Deep tendon reflexes are normal in bilateral upper and lower  extremities.  Sensation is reduced to pinprick in bilateral feet below  the ankles.  Straight leg raising test is negative.   IMPRESSION:  1. 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  2. Depression.   PLAN:  1. We will increase his tramadol to 100 mg t.i.d.  2. Continue Effexor 37.5 b.i.d.  Monitor for serotonin syndrome.  3. Schedule for lumbar epidural injection L3-L4.  Does have some      stenosis at that level.   Need to stop Plavix 5 days before if okay with Dr. Timoteo Gaul.      Erick Colace, M.D.  Electronically Signed     AEK/MedQ  D:  03/12/2008 13:09:26  T:  03/13/2008 04:08:58  Job #:  [id number]   cc:   Dr. Gelene Mink

## 2010-11-03 NOTE — Discharge Summary (Signed)
[name], Gary Trevino                 ACCOUNT NO.:  [id number]   MEDICAL RECORD NO.:  [id number]          PATIENT TYPE:  INP   LOCATION:  5016                         FACILITY:  [facility]   PHYSICIAN:  Coletta Memos, M.D.     DATE OF BIRTH:  [birth year]   DATE OF ADMISSION:  11/16/2006  DATE OF DISCHARGE:  11/18/2006                               DISCHARGE SUMMARY   ADMITTING DIAGNOSES:  1. Cervical stenosis C4-C6.  2. Possible pseudoarthrosis   DISCHARGE DIAGNOSES:  Cervical stenosis C4-C6.   DISCHARGE DESTINATION:  Home.   MEDICATIONS:  1. Percocet 7.5/325.  2. Flexeril.   STATUS:  Alive and well.   SURGEON:  Coletta Memos, M.D.   COMPLICATIONS:  None.   PROCEDURES:  1. Posterior cervical decompression C4-C6.  2. Posterior arthrodesis C4-C6.  3. Posterior lateral mass screw placement C4-C6, bilaterally      complicated.   INDICATIONS:  Mr. Donahoe is a gentleman whom I performed an anterior  cervical decompression arthrodesis from C4-C6 via two diskectomies  approximately 6 months ago.  Postoperatively, he has had residual pain  in his right upper extremity.  MRI, myelogram, post myelogram, CT showed  that he still had some residual narrowing present in the cervical spine.  It was also not clear on his CT myelogram that he had fused.  I  therefore recommended he agreed to undergo decompression via posterior  approach and posterior instrumentation and arthrodesis.   HOSPITAL COURSE:  He was admitted, taken to the operating room and had  an uncomplicated procedure.  He has done well postoperatively.  His  wound is clean, dry and no signs of infection.  He will return to see me  in approximately a week to remove the sutures.  He was discharged home  in good health.  Strength is excellent in the upper extremities.          ______________________________  Coletta Memos, M.D.    KC/MEDQ  D:  11/18/2006  T:  11/18/2006  Job:  [id number]

## 2010-11-03 NOTE — Group Therapy Note (Signed)
CONSULT REQUESTED BY:  Dr. Gelene Mink   CHIEF COMPLAINT:  Back pain greater than neck pain and left medial  forearm pain.   HISTORY:  A 55 year old male with prior history of cervical stenosis  with myelopathy, who had a prior history of ACDF C4 through C6 in 2007  because of residual spinal cord compression secondary to posterior  fusion decompression C4 through C6 on Nov 16, 2006.   The patient has average pain 7-8/10, described as sharp and burning in  the medial left forearm.  Dull, stabbing, tingling, aching in the back  and neck area as well as pain in the feet.  Pain improves a little with  medication.  He has been off the Percocet for a couple of days and mild  withdrawal reaction, and he thinks that the Percocet more or less just  took the edge off the pain.  He needs assistance for meals, for  household duties, and dressing.   REVIEW OF SYSTEMS:  Positive for confusion, depression, anxiety, trouble  walking, spasms, and dizziness.   GI positive for constipation.  Cardiorespiratory positive for sleep  apnea using CPAP with fairly high pressures 16 cm.   PAST MEDICAL HISTORY:  Hypogonadism, on testosterone supplements for the  last 2 years.  He has severe COPD.  Ex-smoker, quit couple of years ago  but had a 120-pack year history.  Alcohol use, former alcoholic, more  heavy drinker at least per his report now he drinks about 3 beers per  week.  He has a prior DUI.  Has a history of remote illegal drug use  when he is in his 15s per his report.  Denies any current.   FAMILY HISTORY:  Heart disease, diabetes, high blood pressure, alcohol  abuse, and cancer.   CURRENT MEDICATIONS:  1. Oxycodone 10/325 ran out a couple of days ago, mild withdrawal      reaction.  2. Diazepam 10 mg t.i.d.  He actually takes more like once or twice a      day.  3. Flecainide 100 mg t.i.d. p.r.n.  4. Lyrica 150 b.i.d. but he really just takes this more or less on a      p.r.n. basis.  5.  He is also on phentermine once a day for weight loss.  6. He is on aspirin and Plavix 81 mg and 75 mg respectively daily.  7. Micardis/hydrochlorothiazide.  8. Omega-3 fatty acid.  9. Lipitor.  10.Vitamin D3.  11.Spiriva.  12.Proventil inhaler as well as Testim 1% cream 2 tubes per day.   SOCIAL HISTORY:  In addition as above, he is divorced and lives alone.   His total count, he has thirteen 10-mg diazepam left.  He has no  oxycodone left in his bottle.  Other physicians include Dr. Jens Som  from [facility], scheduled for peripheral Doppler.   Last MRI of C-spine showed C3-4 facet hypertrophy as well as facet  hypertrophy C5-6 and C6-7.   PHYSICAL EXAMINATION:  GENERAL:  Obese male in no acute distress.  NECK:  Range of motion is about 25% forward flexion, extension, lateral  rotation and bending.  He has full strength in bilateral deltoid,  biceps, triceps, grip, as well as hip flexion, knee extension, and ankle  dorsiflexion.  He has a normal upper and lower extremity range of motion  testing.  BACK:  Has 25% range in forward flexion, extension, lateral rotation,  and bending.  Bending backward is worse than bending forward.   He  has normal sensation upper and lower extremity with some  hypersensitivity at the left fifth digit.  He also has decreased  sensation in left L4 dermatome.   His deep tendon reflexes are normal in bilateral upper and lower  extremities.  Range of motion is normal in bilateral upper and lower  extremities.  Normal coordination in extremities without edema.  He has  multiple tattoos on his skin, upper and lower extremity as well as the  left ear.   CT of the lumbar spine demonstrates mild stenosis in L3-4 and L4-5,  grade 1, with L4 and L5 due to facet and disk degeneration with moderate  canal stenosis at L3-4 and moderate at L4-5.   IMPRESSION:  1. Cervical postlaminectomy syndrome, chronic postoperative pain.  2. Lumbar stenosis.  3.  Paresthesia left fifth digit without ulnar neuropathy.   RECOMMENDATIONS:  1. I discussed some multi-modal approach with the patient.  He has had      some degree of relief with narcotic analgesic in the past with      question of whether the patient really justifies complications he      is experiencing including hypogonadism, which is likely related to      his chronic opiate use, for which he received testosterone      supplementation.  In addition, he does have severe chronic      obstructive pulmonary disease and this is another relative      contraindication to his opiates.  In addition, he really is just      getting minimal relief.  In this respect, we will start him on      tramadol 50 mg p.o. t.i.d.  2. Similarly with his diazepam, once again in view of chronic      obstructive pulmonary disease and the fact that benzodiazepines      really are indicated for total management of anxiety, we will start      him on Robaxin b.i.d. as a long-term treatment.  I think he may      benefit from Psychology or Psychiatry Services.  We will refill his      diazepam 5 mg t.i.d. for 5 days to b.i.d. for 5 days then daily for      5 days and stop.     1. May benefit from cervical facet injections.  2. May benefit from lumbar epidural versus facet injection.   May need a round of physical therapy as well to overall increase his  activity level.   Thank you for the consultation.  You will be apprised of his progress.   Assessed the treatment plan with the patient and he is in agreement.      Erick Colace, M.D.  Electronically Signed     AEK/MedQ  D:  02/13/2008 14:27:27  T:  02/14/2008 04:27:51  Job #:  [id number]   cc:   Dr. Gelene Mink

## 2010-11-06 NOTE — Op Note (Signed)
NAMEBRENDT, DIBLE                 ACCOUNT NO.:  [id number]   MEDICAL RECORD NO.:  [id number]          PATIENT TYPE:  INP   LOCATION:  3013                         FACILITY:  [facility]   PHYSICIAN:  Coletta Memos, M.D.     DATE OF BIRTH:  06/11/1956   DATE OF PROCEDURE:  04/22/2006  DATE OF DISCHARGE:  04/23/2006                                 OPERATIVE REPORT   PREOPERATIVE DIAGNOSIS:  Cervical spondylosis with myelopathy C4-C5 and C5-  C6.   POSTOPERATIVE DIAGNOSIS:  Cervical spondylosis with myelopathy C4-C5 and C5-  C6.   PROCEDURE:  Anterior cervical decompression C4-C and C5-C6, arthrodesis C4  to C6, anterior instrumentation C4 to C6.   COMPLICATIONS:  None.   SURGEON:  Coletta Memos, M.D.   ASSISTANT:  Danae Orleans. Venetia Maxon, M.D.   INDICATIONS:  Gary Trevino is a 55 year old who presented with evidence of  myelopathy and spinal cord compression.  MRI revealed abnormal signal behind  the disc space at C5-C6.  I recommended and he agreed to undergo operative  decompression.   OPERATIVE NOTE:  Mr. Stawicki was brought to the operating room, intubated, and  placed under general anesthetic without difficulty.  He was positioned with  his head in slight extension on a horseshoe head rest with 5 pounds of  traction applied via chin strap.  His neck was prepped and he was draped in  a sterile fashion.  I opened the skin after infiltrating 4 mL 0.5% lidocaine  with 1:200,000 epinephrine.  I opened the skin with a #10 blade and took  this down to the platysma.  I dissected in a plane above the platysma  rostrally and caudally.  I opened the platysma in a horizontal fashion and  then dissected rostrally and caudally in a plane inferior to the platysma.  I then dissected through an avascular corridor to the cervical spine keeping  the strap muscles medially and the carotid artery, jugular vein, and  sternocleidomastoid laterally.  I placed a spinal needle and we were unable  to see its  location secondary to the patient's size.  I placed another  spinal needle one rostral space to that and that was at C3-C4.  I then  reflected the longus colli muscles from C4 to C6.  I then placed distraction  pins, one at C4, the other at C5, and opened the disc space with a #15  blade.  I removed the disc in a progressive fashion.  I did this with the  use of a high-speed drill and removed osteophytes, Kerrison punch, and  pituitary rongeur.  I did this until the C5 nerve roots were decompressed,  most importantly that the spinal cord was decompressed.  After the  decompression was finished, I then achieved hemostasis.  I then prepared the  endplates for arthrodesis.  I drilled the endplates.  I then sized the disc  space and placed an 8 mm graft PEEK interbody filled with morselized  allograft.  I then removed the distraction pin at the C4 and placed it into  C6, distracted that disc space, and  performed a discectomy.  I did this with  pituitary rongeurs, Kerrison punches, curettes and a high-speed drill.  I  decompressed both C6 nerve roots.  I did this so I satisfied with the  decompression.  I then prepared the disc space for arthrodesis.  I drilled  the endplates in preparation for the arthrodesis.  I then sized this space  and again placed in an 8 mm graft filled with morselized allograft.  This  was a PEEK interbody [employer] graft.  I then irrigated the wound.  The weight  was taken off.  The plate was placed, two  screws in C4, two in C5, two in C6.  An x-ray was taken but we could not see  the level of the plate again secondary to the patient's size.  I then closed  wound in a layered fashion using Vicryl sutures to reapproximate the  platysma and subcuticular layers.  Dermabond was used for sterile dressing.           ______________________________  Coletta Memos, M.D.     KC/MEDQ  D:  04/22/2006  T:  04/23/2006  Job:  [id number]

## 2010-11-06 NOTE — Op Note (Signed)
NAMEELVAN, EBRON                 ACCOUNT NO.:  [id number]   MEDICAL RECORD NO.:  [id number]          PATIENT TYPE:  INP   LOCATION:  3013                         FACILITY:  [facility]   PHYSICIAN:  Coletta Memos, M.D.     DATE OF BIRTH:  Sep 01, 1955   DATE OF PROCEDURE:  05/19/2006  DATE OF DISCHARGE:  04/23/2006                               OPERATIVE REPORT   PREOPERATIVE DIAGNOSIS:  Cervical spondylosis C4-5 and C5-6 with  myelopathy.   POSTOPERATIVE DIAGNOSIS:  Cervical spondylosis C4-5 and C5-6 with  myelopathy.   PROCEDURE:  1. Anterior cervical decompression C4-5, C5-6.  2. Arthrodesis using PEEK interbody grafts C4-5 and at C5-6 filled      with morselized allograft.  3. Anterior instrumentation plating placed from C4-C6.   COMPLICATIONS:  None.   SURGEON:  Coletta Memos, M.D.   OPERATIVE NOTE:  Ms. Heavner is brought to the operating room intubated  and placed under general anesthetic without difficulty.  He was  positioned with his head essentially in neutral position on a horseshoe  headrest.  His neck was prepped; and he was draped in sterile fashion.  I opened the skin after infiltrating 1/2% lidocaine with 1:200,000  strength epinephrine.  Starting from the midline extending to the medial  border of the sternocleidomastoid on left side, I dissected down to the  platysma and opened that horizontally dissected rostrally and caudally  to increase my working space.  With an avascular corridor established to  the cervical spine, I placed a spinal needle and then localized the C4-  C5 and C5-6 spaces.  I then opened the disk space at both levels at the  C4-5 and the C5-6.   I then placed distraction pins one at C5 and one at C6 and proceeded  with a discectomy, thereafter, placing self-retaining retractors  underneath the longus colli muscles which were reflected bilaterally.  I  removed the disk in a progressive fashion using high-speed curettes,  pituitary rongeurs, and  Kerrison punches.  I did this until both C6  nerve roots were well decompressed bilaterally.  I then sized the space  and placed a PEEK interbody graft filled with morselized allograft.  I  then removed the distraction pin at C6 and placed it at C4, and  distracted that space.  I removed the disk at C4-5, again, using  pituitary rongeurs and curettes.  I decompressed the spinal canal and  both C5 nerve roots bilaterally at this level.   I then prepared the endplates for arthrodesis; and then placed another  PEEK graft at C4-5.  Having done that, I then removed the distraction  pins.  I then sized the plate and placed it from C4-C6 using two screws  in C4, two screws in  C5, and two screws in C6.  I took an x-ray which showed the plate to be  in good position.  I then irrigated the wound.  I then closed the wound  in a layered fashion using Vicryl sutures.  Dermabond was used for a  sterile dressing.  Postoperatively he was moving all  extremities well.           ______________________________  Coletta Memos, M.D.     KC/MEDQ  D:  05/19/2006  T:  05/19/2006  Job:  [id number]

## 2010-12-04 ENCOUNTER — Encounter: Payer: Self-pay | Admitting: Cardiovascular Disease

## 2011-01-21 ENCOUNTER — Encounter: Payer: Medicare Other | Admitting: Physical Medicine & Rehabilitation

## 2011-01-21 ENCOUNTER — Encounter: Payer: Medicare Other | Attending: Physical Medicine & Rehabilitation

## 2011-01-21 DIAGNOSIS — M545 Low back pain, unspecified: Secondary | ICD-10-CM | POA: Insufficient documentation

## 2011-01-21 DIAGNOSIS — IMO0002 Reserved for concepts with insufficient information to code with codable children: Secondary | ICD-10-CM | POA: Insufficient documentation

## 2011-01-25 ENCOUNTER — Encounter: Payer: Medicare Other | Admitting: Physical Medicine & Rehabilitation

## 2011-02-01 ENCOUNTER — Encounter: Payer: Medicare Other | Admitting: Physical Medicine & Rehabilitation

## 2011-02-01 ENCOUNTER — Ambulatory Visit (HOSPITAL_BASED_OUTPATIENT_CLINIC_OR_DEPARTMENT_OTHER)
Admission: RE | Admit: 2011-02-01 | Discharge: 2011-02-01 | Disposition: A | Discharge status: Home / Self Care | Payer: Medicare Other | Source: Ambulatory Visit | Attending: Physical Medicine & Rehabilitation | Admitting: Physical Medicine & Rehabilitation

## 2011-02-01 DIAGNOSIS — M545 Low back pain, unspecified: Secondary | ICD-10-CM | POA: Insufficient documentation

## 2011-02-01 DIAGNOSIS — IMO0002 Reserved for concepts with insufficient information to code with codable children: Secondary | ICD-10-CM | POA: Insufficient documentation

## 2011-02-01 DIAGNOSIS — M79609 Pain in unspecified limb: Secondary | ICD-10-CM | POA: Insufficient documentation

## 2011-02-11 NOTE — Procedures (Signed)
Gary Trevino, Gary Trevino                 ACCOUNT NO.:  [id number]  MEDICAL RECORD NO.:  [id number]           PATIENT TYPE:  O  LOCATION:  TPC                          FACILITY:  [facility]  PHYSICIAN:  Erick Colace, M.D.DATE OF BIRTH:  [birth year]  DATE OF PROCEDURE: DATE OF DISCHARGE:                              OPERATIVE REPORT  PROCEDURE:  Left L3-4 translaminar lumbar epidural steroid injection under fluoroscopic guidance.  INDICATION:  Lumbar pain as well as left lower extremity radicular pain. Pain has been relieved in the past with epidural injections at the same level for 64-month duration.  His pain is only partially responsive to medication management other conservative care, interferes with activities.  Informed consent was obtained after describing risks and benefits of the procedure with the patient.  These include bleeding, bruising, and infection.  He elects to proceed and has given written consent.  The patient placed prone on fluoroscopy table.  Betadine prep, sterile drape, a 25-gauge inch and half needle was used to anesthetize the skin, subcu tissue, 1% lidocaine x2 mL.  Then a 17-gauge Tuohy needle was inserted under fluoroscopic guidance into the left L3-4 interlaminar space.  AP images utilized tented lateral, but fluoroscopy unit OEC 9600 and did not provide a good lateral view.  Loss-of-resistance technique was utilized using 50:50 air saline mix, positive loss of resistance was obtained and confirmed with Omnipaque 180 x2 mL under live fluoro demonstrating good epidural spread followed by injection of 2 mL of 40 mg/mL Depo-Medrol and 2 mL of 1% lidocaine.  The patient tolerated the procedure well.  Postprocedure instructions given.     Erick Colace, M.D. Electronically Signed    AEK/MEDQ  D:  02/01/2011 15:17:15  T:  02/01/2011 21:57:15  Job:  [id number]

## 2011-05-06 ENCOUNTER — Encounter: Payer: Medicare Other | Admitting: Physical Medicine & Rehabilitation

## 2011-05-20 ENCOUNTER — Encounter: Payer: Medicare Other | Attending: Physical Medicine & Rehabilitation

## 2011-05-20 ENCOUNTER — Encounter: Payer: Medicare Other | Admitting: Physical Medicine & Rehabilitation

## 2011-05-20 DIAGNOSIS — IMO0002 Reserved for concepts with insufficient information to code with codable children: Secondary | ICD-10-CM | POA: Insufficient documentation

## 2011-05-20 DIAGNOSIS — M545 Low back pain, unspecified: Secondary | ICD-10-CM | POA: Insufficient documentation

## 2011-05-20 NOTE — Procedures (Signed)
NAMEELVAN, EBRON                 ACCOUNT NO.:  [id number]  MEDICAL RECORD NO.:  [id number]           PATIENT TYPE:  O  LOCATION:  TPC                          FACILITY:  [facility]  PHYSICIAN:  Erick Colace, M.D.DATE OF BIRTH:  May 26, 1956  DATE OF PROCEDURE:  05/20/2011 DATE OF DISCHARGE:  10/26/2010                              OPERATIVE REPORT  PROCEDURE:  Left L3-4 translaminar lumbar epidural steroid injection under fluoroscopic guidance.  INDICATION:  Lumbar pain as well as left lower extremity radicular pain, relieved in the past with epidural injections for 2-3 month duration. His pain is only partially response to medication management and other conservative care and interferes with activities.  Informed consent was obtained after describing risks and benefits of the procedure with the patient.  These include bleeding, bruising, and infection.  He elects to proceed and has given written consent.  The patient placed prone on fluoroscopy table.  Betadine prep, sterile drape, 25-gauge inch and half needle was used to anesthetize the skin and subcutaneous tissue with 1% lidocaine x2 mL.  Then, a 17-gauge Tuohy needle was inserted under fluoroscopic guidance into the left L3-4 interlaminar space.  AP and lateral images utilized.  Omnipaque 180 demonstrated good epidural spread after obtaining loss of resistance with 50:50 air saline mix.  2 mL of 40 mg/mL Depo-Medrol and 2 mL of lidocaine 1% injected.  The patient tolerated procedure well.  Depo- Medrol was manufactured by [employer].     Erick Colace, M.D. Electronically Signed    AEK/MEDQ  D:  05/20/2011 15:08:30  T:  05/20/2011 21:38:17  Job:  [id number]

## 2011-08-16 ENCOUNTER — Encounter: Payer: Medicare Other | Attending: Physical Medicine & Rehabilitation

## 2011-08-16 ENCOUNTER — Encounter: Payer: Self-pay | Admitting: Physical Medicine & Rehabilitation

## 2011-08-16 ENCOUNTER — Ambulatory Visit (HOSPITAL_BASED_OUTPATIENT_CLINIC_OR_DEPARTMENT_OTHER): Payer: Medicare Other | Admitting: Physical Medicine & Rehabilitation

## 2011-08-16 VITALS — BP 95/65 | HR 70 | Resp 18 | Ht 69.0 in | Wt 333.0 lb

## 2011-08-16 DIAGNOSIS — IMO0002 Reserved for concepts with insufficient information to code with codable children: Secondary | ICD-10-CM | POA: Insufficient documentation

## 2011-08-16 DIAGNOSIS — M5416 Radiculopathy, lumbar region: Secondary | ICD-10-CM | POA: Insufficient documentation

## 2011-08-16 NOTE — Progress Notes (Signed)
  Subjective:    Patient ID: Gary Trevino, male    DOB: 10-24-1955, 56 y.o.   MRN: [id number]  HPI    Review of Systems     Objective:   Physical Exam        Assessment & Plan:    PROCEDURE RECORD [facility]   Name: Gary Trevino DOB:Mar 01, 1956 MRN: [id number]  Date:08/16/2011  Physician: Claudette Laws, MD    Nurse/CMA: Iban Utz RN  Allergies:  Allergies  Allergen Reactions  . Allopurinol     Consent Signed: yes  Is patient diabetic? no  CBG today?   Pregnant: no LMP: No LMP for male patient. (age 87-55)  Anticoagulants: no Anti-inflammatory: no Antibiotics: no  Procedure: L3-L4 Translaminar Epidural Steroid injection  Position: proneProne Start Time: 10:55  End Time: 10:59  Fluoro Time: 15 sec  RN/CMA CBrown RN Gracynn Rajewski RN    Time  11:04    BP 95/65 102/67    Pulse 70 74    Respirations 18 18    O2 Sat 97% 95%    S/S 6 6    Pain Level 7/10 4/10     D/C home with Gary Trevino, patient A & O X 3, D/C instructions reviewed, and sits independently.

## 2011-08-16 NOTE — Progress Notes (Deleted)
  Subjective:    Patient ID: Gary Trevino, male    DOB: June 05, 1956, 56 y.o.   MRN: [id number]  HPI    Review of Systems     Objective:   Physical Exam        Assessment & Plan:    PROCEDURE RECORD [facility]   Name: [name] DOB:July 04, 1955 MRN: [id number]  Date:08/16/2011  Physician: {CPRM-PROVIDERS:[id number]}    Nurse/CMA: ***  Allergies:  Allergies  Allergen Reactions  . Allopurinol     Consent Signed: {yes no:314532}  Is patient diabetic? {yes no:314532}  CBG today? ***  Pregnant: {yes no:314532} LMP: No LMP for male patient. (age 48-55)  Anticoagulants: {Yes/No:19989} Anti-inflammatory: {Yes/No:19989} Antibiotics: {Yes/No:19989}  Procedure: ***  Position: {PRONE/SUPINE/SITTING/LATERAL:21022916} Start Time: ***  End Time: ***  Fluoro Time: ***  RN/CMA *** ***    Time *** ***    BP *** ***    Pulse *** ***    Respirations *** ***    O2 Sat *** ***    S/S *** ***    Pain Level *** ***     D/C home with ***, patient A & O X 3, D/C instructions reviewed, and sits independently.

## 2011-08-16 NOTE — Progress Notes (Signed)
Lumbar epidural steroid injection under fluoroscopic guidance  Indication: Lumbosacral radiculitis is not relieved by medication management or other conservative care and interfering with self-care and mobility.  Informed consent was obtained after describing risk and benefits of the procedure with the patient, this includes bleeding, bruising, infection, paralysis and medication side effects.  The patient wishes to proceed and has given written consent.  Patient was placed in a prone position.  The lumbar area was marked and prepped with Betadine.  It was entered with a 25-gauge 1-1/2 inch needle and one mL of 1% lidocaine was injected into the skin and subcutaneous tissue.  Then a 17-gauge spinal needle was inserted under fluoroscopic guidance into the left L3-4 interlaminar space under AP and Lateral imaging.  Once needle tip of approximated the posterior elements, a loss of resistance technique was utilized with lateral imaging.  A positive loss of resistance was obtained and then confirmed by injecting 2 mL's of Omnipaque 180.  Then a solution containing 2 mL's of 40 mg per mL depomedrol and 2 mL's of 1% lidocaine was injected.  The patient tolerated procedure well.  Post procedure instructions were given.  Please see post procedure form.

## 2011-08-16 NOTE — Patient Instructions (Signed)
Discharge instructions reviewed with patient and patient was given a copy. 

## 2011-09-13 ENCOUNTER — Encounter: Payer: Self-pay | Admitting: Physical Medicine & Rehabilitation

## 2011-09-28 ENCOUNTER — Telehealth: Payer: Self-pay | Admitting: *Deleted

## 2011-09-28 NOTE — Telephone Encounter (Signed)
Fax refill request for effexor 37.5mg  bid #60. Last Rx was in October. App't 11/08/11. OK to refill?

## 2011-09-28 NOTE — Telephone Encounter (Signed)
Okay to refill x2 

## 2011-09-29 ENCOUNTER — Other Ambulatory Visit: Payer: Self-pay | Admitting: *Deleted

## 2011-09-29 MED ORDER — VENLAFAXINE HCL 37.5 MG PO TABS: 37.5000 mg | ORAL_TABLET | Freq: Two times a day (BID) | ORAL | Status: DC

## 2011-11-04 ENCOUNTER — Ambulatory Visit: Payer: Medicare Other | Admitting: Physical Medicine & Rehabilitation

## 2011-11-08 ENCOUNTER — Ambulatory Visit: Payer: Medicare Other | Admitting: Physical Medicine & Rehabilitation

## 2011-11-11 ENCOUNTER — Ambulatory Visit: Payer: Medicare Other | Admitting: Physical Medicine & Rehabilitation

## 2011-11-24 ENCOUNTER — Other Ambulatory Visit: Payer: Self-pay | Admitting: Family Medicine

## 2011-11-24 ENCOUNTER — Ambulatory Visit
Admission: RE | Admit: 2011-11-24 | Discharge: 2011-11-24 | Disposition: A | Discharge status: Home / Self Care | Payer: Medicare Other | Source: Ambulatory Visit | Attending: Family Medicine | Admitting: Family Medicine

## 2011-11-24 DIAGNOSIS — M545 Low back pain: Secondary | ICD-10-CM

## 2011-12-04 ENCOUNTER — Other Ambulatory Visit: Payer: Medicare Other

## 2011-12-09 ENCOUNTER — Encounter: Payer: Medicare Other | Attending: Physical Medicine & Rehabilitation

## 2011-12-09 ENCOUNTER — Ambulatory Visit (HOSPITAL_BASED_OUTPATIENT_CLINIC_OR_DEPARTMENT_OTHER): Payer: Medicare Other | Admitting: Physical Medicine & Rehabilitation

## 2011-12-09 ENCOUNTER — Encounter: Payer: Self-pay | Admitting: Physical Medicine & Rehabilitation

## 2011-12-09 VITALS — BP 112/74 | HR 68 | Resp 16 | Ht 69.0 in | Wt 325.2 lb

## 2011-12-09 DIAGNOSIS — IMO0002 Reserved for concepts with insufficient information to code with codable children: Secondary | ICD-10-CM | POA: Insufficient documentation

## 2011-12-09 NOTE — Progress Notes (Signed)
Lumbar epidural steroid injection under fluoroscopic guidance  Indication: Lumbosacral radiculitis is not relieved by medication management or other conservative care and interfering with self-care and mobility.  Informed consent was obtained after describing risk and benefits of the procedure with the patient, this includes bleeding, bruising, infection, paralysis and medication side effects.  The patient wishes to proceed and has given written consent.  Patient was placed in a prone position.  The lumbar area was marked and prepped with Betadine.  It was entered with a 25-gauge 1-1/2 inch needle and one mL of 1% lidocaine was injected into the skin and subcutaneous tissue.  Then a 17-gauge spinal needle was inserted under fluoroscopic guidance into the Left L3-4 interlaminar space under AP and Lateral imaging.  Once needle tip of approximated the posterior elements, a loss of resistance technique was utilized with lateral imaging.  A positive loss of resistance was obtained and then confirmed by injecting 2 mL's of Omnipaque 180.  Then a solution containing 2 mL's of 40 mg per mL depomedrol and 2 mL's of 1% lidocaine was injected.  The patient tolerated procedure well.  Post procedure instructions were given.  Please se post procedure form.

## 2011-12-09 NOTE — Patient Instructions (Addendum)
Epidural Steroid Injection An epidural steroid injection is given to relieve pain in the neck, back, or legs. This procedure involves injecting a steroid and numbing medicine (local anesthetic) into the epidural space. The epidural space is the space between the outer covering of the spinal cord and the vertebra. The epidural steroid injection helps in reducing the pain that is caused by the irritation or swelling of the nerve root. However, it does not cure the underlying problem. The injection may be given for the following conditions:  Changes in the soft, gel-like cushion between two vertebrae (disk) due to wear and tear.   A reduction in the space within the spinal canal.   Slipped or herniated disk.   Low back (lumbar) sprain.   Sciatica. This is shooting pain that radiates down the buttocks and the back of the leg due to compression of the nerve.   Traumatic compression fracture of the vertebra.   Pain that develops after a surgery of the spine.   Pain that arises after an attack of viral infection affecting the nerves (shingles).  LET YOUR CAREGIVER KNOW ABOUT:   Allergies to food or medicine.   Medicines taken, including vitamins, herbs, eyedrops, over-the-counter medicines, and creams.   Use of steroids (by mouth or creams).   Previous problems with anesthetics or numbing medicines.   History of bleeding problems or blood clots.   Previous surgery.   Other health problems, including diabetes and kidney problems.   Possibility of pregnancy, if this applies.     RISKS AND COMPLICATIONS The complications due to the needle insertion are:  Headache.   Bleeding.   Infection.   Allergic reaction to the medicines.   Damage to the nerves.  The complications due to the steroid are:  Weight gain.   Hot flashes.   Mood swings.   Lack of sleep.   Increase in blood sugar levels, especially if you are diabetic.   Retention of water.  The response to this  procedure depends on the underlying cause of the pain and its duration. Patients who have long-term (chronic) pain are less likely to benefit from epidural steroids than are patients whose pain comes on strong and suddenly. BEFORE THE PROCEDURE   The caregiver may ask about your symptoms, do a detailed exam, and advise some tests. These tests may include imaging studies. Your caregiver may review the results of your tests and discuss the procedure with you.   Ask your caregiver about changing or stopping your regular medicines. You may be advised to stop taking blood-thinning medicines a few days before the procedure.   You may also be given medicines to reduce your anxiety.  PROCEDURE  You will remain awake during the whole procedure. Although, you may receive medicine to make you sleepy. You will be asked to lie on your stomach. The site of the injection is cleansed. Then, the injection site is numbed using a small amount of medicine that numbs the area (local anesthetic). A hollow needle is directed through your skin into the epidural space with the help of an X-ray. The X-ray helps to ensure that the steroid is delivered closest to the affected nerve. You may have some minimal discomfort at this time. Once the needle is in the right position, the local anesthetic and the steroid are injected into the epidural space. The needle is then removed. The skin is cleaned and a bandage is applied. The entire procedure takes only a few minutes, although repeated injections may   be required (up to 3 to 4 injections over several weeks).  AFTER THE PROCEDURE   You may be monitored for a short time before you go home.   You may feel weakness or numbness in your arm or leg, which disappears within 1 to 2 hours.   Someone must drive you home or accompany you home if you are taking a taxi.   You may be allowed to eat, drink, and take your regular medicine.   Your pain may improve or worsen right after the  procedure.   You may feel the beneficial effect of the steroid a few days later.   You may have soreness at the site of the injection.   If you have only partial relief of the pain, the injection may be repeated once or even twice within 4 to 8 weeks of the initial injection.  Document Released: 09/14/2007 Document Revised: 05/27/2011 Document Reviewed: 10/17/2008 Exit[employer] Patient Information 2012 [employer]. 

## 2011-12-09 NOTE — Progress Notes (Signed)
  PROCEDURE RECORD [facility]   Name: Gary Trevino DOB:1955-10-08 MRN: [id number]  Date:12/09/2011  Physician: Claudette Laws, MD    Nurse/CMA: Davion Meara RN  Allergies: No Known Allergies  Consent Signed: yes  Is patient diabetic? no  CBG today?   Pregnant: no LMP: No LMP for male patient. (age 57-55)  Anticoagulants: no Anti-inflammatory: no Antibiotics: no  Procedure: Lumbar Epidural Steroid injection Position: Prone Start Time:  10:05 End Time:   10:09    Fluoro Time:15 sec  RN/CMA Haematologist RN    Time 9:37 10:12    BP 112/74 109/74    Pulse 68 68    Respirations 16 16    O2 Sat 96 96    S/S 6 6    Pain Level 6/10 3/10     D/C home with [name], patient A & O X 3, D/C instructions reviewed, and sits independently.

## 2012-03-09 ENCOUNTER — Encounter: Payer: Medicare Other | Attending: Physical Medicine & Rehabilitation

## 2012-03-09 ENCOUNTER — Telehealth: Payer: Self-pay | Admitting: Physical Medicine & Rehabilitation

## 2012-03-09 ENCOUNTER — Encounter: Payer: Self-pay | Admitting: Physical Medicine & Rehabilitation

## 2012-03-09 ENCOUNTER — Ambulatory Visit (HOSPITAL_BASED_OUTPATIENT_CLINIC_OR_DEPARTMENT_OTHER): Payer: Medicare Other | Admitting: Physical Medicine & Rehabilitation

## 2012-03-09 VITALS — BP 123/69 | HR 73 | Resp 16 | Ht 69.0 in | Wt 321.0 lb

## 2012-03-09 DIAGNOSIS — IMO0002 Reserved for concepts with insufficient information to code with codable children: Secondary | ICD-10-CM

## 2012-03-09 MED ORDER — VENLAFAXINE HCL 37.5 MG PO TABS: 37.5000 mg | ORAL_TABLET | Freq: Two times a day (BID) | ORAL | Status: DC

## 2012-03-09 MED ORDER — TRAMADOL HCL 50 MG PO TABS: 50.0000 mg | ORAL_TABLET | Freq: Four times a day (QID) | ORAL | Status: DC | PRN

## 2012-03-09 MED ORDER — GABAPENTIN 100 MG PO CAPS: 100.0000 mg | ORAL_CAPSULE | Freq: Three times a day (TID) | ORAL | Status: DC

## 2012-03-09 NOTE — Progress Notes (Signed)
  PROCEDURE RECORD [facility]   Name: Gary Trevino DOB:[birth year] MRN: [id number]  Date:03/09/2012  Physician: Claudette Laws, MD    Nurse/CMA: Redgie Grayer  Allergies: No Known Allergies  Consent Signed: yes  Is patient diabetic? no   Pregnant: no LMP: No LMP for male patient. (age 57-55)  Anticoagulants: no Anti-inflammatory: no Antibiotics: no  Procedure: LESI  Position: Prone Start Time: 10:05am  End Time: 10:10am  Fluoro Time: 19 seconds  RN/CMA Carroll,CMA Carroll,CMA    Time 9:32am 10:13am    BP 123/69 121/70    Pulse 73 84    Respirations 16 16    O2 Sat 97% 99%    S/S 6 6    Pain Level 8/10 6/10     D/C home with his mother, patient A & O X 3, D/C instructions reviewed, and sits independently.

## 2012-03-09 NOTE — Patient Instructions (Addendum)
Epidural Steroid Injection Care After  Refer to this sheet in the next few weeks. These instructions provide you with information on caring for yourself after your procedure. Your caregiver may also give you more specific instructions. Your treatment has been planned according to current medical practices, but problems sometimes occur. Call your caregiver if you have any problems or questions after your procedure. HOME CARE INSTRUCTIONS   Avoid the use of heat on the injection site for a day.   Do not have a tub bath or soak in water for the rest of the day.   Remove the bandage on the next day.   Resume your normal activities on the next day.   Use ice packs or mild pain relievers to reduce the soreness around the injection site.   Follow up with your caregiver 7 to 10 days after the procedure.  SEEK MEDICAL CARE IF:   You develop a fever of more than 100.5 F (38.1 C).   You continue to have pain and soreness over the injection site even after taking medicines.   You develop significant nausea or vomiting.  SEEK IMMEDIATE MEDICAL CARE IF:   You have severe back pain, which is not relieved by medicines.   You develop severe headache, stiff neck, or sensitivity to light.   You develop any new numbness or weakness of your legs.   You lose control over your bladder or bowel movements.   You develop a fever of more than 102 F (38.9 C).   You develop difficulty breathing.  Document Released: 09/22/2010 Document Revised: 05/27/2011 Document Reviewed: 09/22/2010 [employer] Patient Information 2012 [employer]. 

## 2012-03-09 NOTE — Progress Notes (Signed)
Lumbar epidural steroid injection under fluoroscopic guidance  Indication: Lumbosacral radiculitis is not relieved by medication management or other conservative care and interfering with self-care and mobility.  Informed consent was obtained after describing risk and benefits of the procedure with the patient, this includes bleeding, bruising, infection, paralysis and medication side effects.  The patient wishes to proceed and has given written consent.  Patient was placed in a prone position.  The lumbar area was marked and prepped with Betadine.  It was entered with a 25-gauge 1-1/2 inch needle and one mL of 1% lidocaine was injected into the skin and subcutaneous tissue.  Then a 17-gauge spinal needle was inserted under fluoroscopic guidance into the Left L3-4 interlaminar space under AP and Lateral imaging.  Once needle tip of approximated the posterior elements, a loss of resistance technique was utilized with lateral imaging.  A positive loss of resistance was obtained and then confirmed by injecting 2 mL's of Omnipaque 180.  Then a solution containing 2 mL's of 40 mg per mL depomedrol and 2 mL's of 1% lidocaine was injected.  The patient tolerated procedure well.  Post procedure instructions were given.  Please see post procedure form. 

## 2012-03-09 NOTE — Telephone Encounter (Signed)
Clarification on Ultracet.

## 2012-06-08 ENCOUNTER — Encounter: Payer: Medicare Other | Attending: Physical Medicine & Rehabilitation

## 2012-06-08 ENCOUNTER — Ambulatory Visit (HOSPITAL_BASED_OUTPATIENT_CLINIC_OR_DEPARTMENT_OTHER): Payer: Medicare Other | Admitting: Physical Medicine & Rehabilitation

## 2012-06-08 ENCOUNTER — Encounter: Payer: Self-pay | Admitting: Physical Medicine & Rehabilitation

## 2012-06-08 VITALS — BP 125/77 | HR 82 | Resp 14 | Ht 69.0 in | Wt 328.0 lb

## 2012-06-08 DIAGNOSIS — IMO0002 Reserved for concepts with insufficient information to code with codable children: Secondary | ICD-10-CM

## 2012-06-08 NOTE — Patient Instructions (Signed)
Please start walking 30 minutes a day to help lose weight

## 2012-06-08 NOTE — Progress Notes (Signed)
  PROCEDURE RECORD [facility]   Name: Gary Trevino DOB:01-Sep-1955 MRN: [id number]  Date:06/08/2012  Physician: Claudette Laws, MD    Nurse/CMA: Gillis Ends, RN  Allergies: No Known Allergies  Consent Signed: yes  Is patient diabetic? no  CBG today?   Pregnant: no LMP: No LMP for male patient. (age 57-55)  Anticoagulants: no Anti-inflammatory: no Antibiotics: no  Procedure: Lumbar Epidural Steroid Injection L3-4  Position: Prone Start Time:  9:53 End Time: 9:57  Fluoro Time: 14 seconds  RN/CMA Shumaker, Manufacturing systems engineer, RN    Time 9:05am 10:01    BP 125/77 124/82    Pulse 82 73    Respirations 14 14    O2 Sat 94 98    S/S 6 6    Pain Level 7 6/10     D/C home with [name], patient A & O X 3, D/C instructions reviewed, and sits independently.

## 2012-06-08 NOTE — Progress Notes (Signed)
Lumbar epidural steroid injection under fluoroscopic guidance  Indication: Lumbosacral radiculitis is not relieved by medication management or other conservative care and interfering with self-care and mobility.  Informed consent was obtained after describing risk and benefits of the procedure with the patient, this includes bleeding, bruising, infection, paralysis and medication side effects.  The patient wishes to proceed and has given written consent.  Patient was placed in a prone position.  The lumbar area was marked and prepped with Betadine.  It was entered with a 25-gauge 1-1/2 inch needle and one mL of 1% lidocaine was injected into the skin and subcutaneous tissue.  Then a 17-gauge spinal needle was inserted under fluoroscopic guidance into the Left L3-4 interlaminar space under AP and Lateral imaging.  Once needle tip of approximated the posterior elements, a loss of resistance technique was utilized with lateral imaging.  A positive loss of resistance was obtained and then confirmed by injecting 2 mL's of Omnipaque 180.  Then a solution containing 2 mL's of 40 mg per mL depomedrol and 2 mL's of 1% lidocaine was injected.  The patient tolerated procedure well.  Post procedure instructions were given.  Please see post procedure form.

## 2012-08-31 ENCOUNTER — Ambulatory Visit: Payer: Medicare Other | Admitting: Physical Medicine & Rehabilitation

## 2012-08-31 ENCOUNTER — Encounter: Payer: Medicare Other | Attending: Physical Medicine & Rehabilitation

## 2012-08-31 DIAGNOSIS — IMO0002 Reserved for concepts with insufficient information to code with codable children: Secondary | ICD-10-CM | POA: Insufficient documentation

## 2012-09-26 ENCOUNTER — Encounter: Payer: Medicare Other | Attending: Physical Medicine & Rehabilitation

## 2012-09-26 ENCOUNTER — Ambulatory Visit (HOSPITAL_BASED_OUTPATIENT_CLINIC_OR_DEPARTMENT_OTHER): Payer: Medicare Other | Admitting: Physical Medicine & Rehabilitation

## 2012-09-26 ENCOUNTER — Encounter: Payer: Self-pay | Admitting: Physical Medicine & Rehabilitation

## 2012-09-26 VITALS — BP 124/79 | HR 62 | Ht 69.0 in | Wt 329.2 lb

## 2012-09-26 DIAGNOSIS — IMO0002 Reserved for concepts with insufficient information to code with codable children: Secondary | ICD-10-CM

## 2012-09-26 MED ORDER — VENLAFAXINE HCL 37.5 MG PO TABS: 37.5000 mg | ORAL_TABLET | Freq: Two times a day (BID) | ORAL | Status: DC

## 2012-09-26 MED ORDER — TRAMADOL HCL 50 MG PO TABS: 100.0000 mg | ORAL_TABLET | Freq: Four times a day (QID) | ORAL | Status: DC

## 2012-09-26 NOTE — Patient Instructions (Signed)
Epidural Steroid Injection Care After  Refer to this sheet in the next few weeks. These instructions provide you with information on caring for yourself after your procedure. Your caregiver may also give you more specific instructions. Your treatment has been planned according to current medical practices, but problems sometimes occur. Call your caregiver if you have any problems or questions after your procedure. HOME CARE INSTRUCTIONS   Avoid the use of heat on the injection site for a day.  Do not have a tub bath or soak in water for the rest of the day.  Remove the bandage on the next day.  Resume your normal activities on the next day.  Use ice packs or mild pain relievers to reduce the soreness around the injection site.  Follow up with your caregiver 7 to 10 days after the procedure. SEEK MEDICAL CARE IF:   You develop a fever of more than 100.5 F (38.1 C).  You continue to have pain and soreness over the injection site even after taking medicines.  You develop significant nausea or vomiting. SEEK IMMEDIATE MEDICAL CARE IF:   You have severe back pain, which is not relieved by medicines.  You develop severe headache, stiff neck, or sensitivity to light.  You develop any new numbness or weakness of your legs.  You lose control over your bladder or bowel movements.  You develop a fever of more than 102 F (38.9 C).  You develop difficulty breathing. Document Released: 09/22/2010 Document Revised: 08/30/2011 Document Reviewed: 09/22/2010 Exit[employer] Patient Information 2013 [employer].  

## 2012-09-26 NOTE — Progress Notes (Signed)
Lumbar epidural steroid injection under fluoroscopic guidance  Indication: Lumbosacral radiculitis is not relieved by medication management or other conservative care and interfering with self-care and mobility.  Informed consent was obtained after describing risk and benefits of the procedure with the patient, this includes bleeding, bruising, infection, paralysis and medication side effects.  The patient wishes to proceed and has given written consent.  Patient was placed in a prone position.  The lumbar area was marked and prepped with Betadine.  It was entered with a 25-gauge 1-1/2 inch needle and one mL of 1% lidocaine was injected into the skin and subcutaneous tissue.  Then a 18-gauge 6 inch spinal needle was inserted under fluoroscopic guidance into the Left L3-4 interlaminar space under AP and Lateral imaging.  Once needle tip of approximated the posterior elements, a loss of resistance technique was utilized with lateral imaging.  A positive loss of resistance was obtained and then confirmed by injecting 2 mL's of Omnipaque 180.  Then a solution containing 2 mL's of 40 mg per mL depomedrol and 2 mL's of 1% lidocaine was injected.  The patient tolerated procedure well.  Post procedure instructions were given.  Please see post procedure form.

## 2012-09-26 NOTE — Progress Notes (Signed)
  PROCEDURE RECORD [facility]   Name: Gary Trevino DOB:Nov 11, 1955 MRN: [id number]  Date:09/26/2012  Physician: Claudette Laws, MD    Nurse/CMA: Shumaker RN  Allergies: No Known Allergies  Consent Signed: yes  Is patient diabetic? no  CBG today?   Pregnant: no LMP: No LMP for male patient. (age 58-55)  Anticoagulants: no Anti-inflammatory: no Antibiotics: no  Procedure: Lumbar Epidural Steroid Injection Position: Prone Start Time: 1:22  End Time: 1:28 Fluoro Time: 17 sec  RN/CMA Haematologist RN    Time 12:57     BP 129/74     Pulse 62     Respirations 14     O2 Sat 96     S/S 6 6    Pain Level 7      D/C home with Susa Simmonds, patient A & O X 3, D/C instructions reviewed, and sits independently.

## 2012-12-28 ENCOUNTER — Ambulatory Visit: Payer: Medicare Other | Admitting: Physical Medicine & Rehabilitation

## 2013-01-16 ENCOUNTER — Ambulatory Visit: Payer: Medicare Other | Admitting: Physical Medicine & Rehabilitation

## 2013-02-15 ENCOUNTER — Ambulatory Visit: Payer: Medicare Other | Admitting: Physical Medicine & Rehabilitation

## 2013-03-15 ENCOUNTER — Encounter: Payer: Medicare Other | Attending: Physical Medicine & Rehabilitation

## 2013-03-15 ENCOUNTER — Ambulatory Visit (HOSPITAL_BASED_OUTPATIENT_CLINIC_OR_DEPARTMENT_OTHER): Payer: Medicare Other | Admitting: Physical Medicine & Rehabilitation

## 2013-03-15 ENCOUNTER — Encounter: Payer: Self-pay | Admitting: Physical Medicine & Rehabilitation

## 2013-03-15 VITALS — BP 140/75 | HR 68 | Resp 16 | Ht 69.0 in | Wt 337.0 lb

## 2013-03-15 DIAGNOSIS — IMO0002 Reserved for concepts with insufficient information to code with codable children: Secondary | ICD-10-CM

## 2013-03-15 NOTE — Progress Notes (Signed)
Lumbar epidural steroid injection under fluoroscopic guidance  Indication: Lumbosacral radiculitis is not relieved by medication management or other conservative care and interfering with self-care and mobility.  Informed consent was obtained after describing risk and benefits of the procedure with the patient, this includes bleeding, bruising, infection, paralysis and medication side effects.  The patient wishes to proceed and has given written consent.  Patient was placed in a prone position.  The lumbar area was marked and prepped with Betadine.  It was entered with a 25-gauge 1-1/2 inch needle and one mL of 1% lidocaine was injected into the skin and subcutaneous tissue.  Then a 18-gauge 6 inch spinal needle was inserted under fluoroscopic guidance into the Left L3-4 interlaminar space under AP and Lateral imaging.  Once needle tip of approximated the posterior elements, a loss of resistance technique was utilized with lateral imaging.  A positive loss of resistance was obtained and then confirmed by injecting 2 mL's of Omnipaque 180.  Then a solution containing 2 mL's of 40 mg per mL depomedrol and 2 mL's of 1% lidocaine was injected.  The patient tolerated procedure well.  Post procedure instructions were given.  Please see post procedure form.

## 2013-03-15 NOTE — Progress Notes (Signed)
  PROCEDURE RECORD [facility]   Name: Gary Trevino DOB:November 22, 1955 MRN: [id number]  Date:03/15/2013  Physician: Claudette Laws, MD    Nurse/CMA: Kelli Churn, CMA  Allergies: No Known Allergies  Consent Signed: yes  Is patient diabetic? no    Pregnant: no LMP: No LMP for male patient. (age 58-55)  Anticoagulants: no Anti-inflammatory: no Antibiotics: no  Procedure: Trans lumbar epidural steroid injection  Position: Prone Start Time:  1227 End Time:  1237 Fluoro Time: 21  RN/CMA Levens, CMA Levens, CMA    Time 1159 1241    BP 140/75 129/89    Pulse 68 59    Respirations 16 16    O2 Sat 95 99    S/S 6 6    Pain Level 7/10 6/10     D/C home with [name], patient A & O X 3, D/C instructions reviewed, and sits independently.

## 2013-06-07 ENCOUNTER — Ambulatory Visit: Payer: Medicare Other | Admitting: Physical Medicine & Rehabilitation

## 2013-07-19 ENCOUNTER — Ambulatory Visit: Payer: Medicare Other | Admitting: Physical Medicine & Rehabilitation

## 2013-08-21 ENCOUNTER — Ambulatory Visit: Payer: Medicare Other | Admitting: Physical Medicine & Rehabilitation

## 2013-08-21 ENCOUNTER — Encounter: Payer: Medicare Other | Attending: Physical Medicine & Rehabilitation

## 2013-10-22 ENCOUNTER — Other Ambulatory Visit: Payer: Self-pay | Admitting: Physical Medicine & Rehabilitation

## 2013-10-22 ENCOUNTER — Other Ambulatory Visit: Payer: Self-pay

## 2013-10-30 ENCOUNTER — Other Ambulatory Visit: Payer: Self-pay | Admitting: Physical Medicine & Rehabilitation

## 2013-11-02 ENCOUNTER — Telehealth: Payer: Self-pay

## 2013-11-02 ENCOUNTER — Other Ambulatory Visit: Payer: Self-pay | Admitting: Physical Medicine & Rehabilitation

## 2013-11-02 NOTE — Telephone Encounter (Signed)
Contacted patient to inform him that he would need a appt for further refills since he has not been seen since 03/15/13. Patient verbalized understanding.

## 2013-11-02 NOTE — Telephone Encounter (Signed)
Patient is requesting a refill on his medications. Patient didn't state which medications. Last OV was 03/15/13.

## 2013-11-22 ENCOUNTER — Encounter: Payer: Self-pay | Admitting: Physical Medicine & Rehabilitation

## 2013-11-22 ENCOUNTER — Encounter: Payer: Medicare Other | Attending: Physical Medicine & Rehabilitation

## 2013-11-22 ENCOUNTER — Ambulatory Visit (HOSPITAL_BASED_OUTPATIENT_CLINIC_OR_DEPARTMENT_OTHER): Payer: Medicare Other | Admitting: Physical Medicine & Rehabilitation

## 2013-11-22 VITALS — BP 153/86 | HR 60 | Resp 14 | Wt 338.0 lb

## 2013-11-22 DIAGNOSIS — M47817 Spondylosis without myelopathy or radiculopathy, lumbosacral region: Secondary | ICD-10-CM

## 2013-11-22 DIAGNOSIS — M4316 Spondylolisthesis, lumbar region: Secondary | ICD-10-CM | POA: Insufficient documentation

## 2013-11-22 DIAGNOSIS — M961 Postlaminectomy syndrome, not elsewhere classified: Secondary | ICD-10-CM | POA: Insufficient documentation

## 2013-11-22 DIAGNOSIS — Z5189 Encounter for other specified aftercare: Secondary | ICD-10-CM | POA: Insufficient documentation

## 2013-11-22 DIAGNOSIS — IMO0002 Reserved for concepts with insufficient information to code with codable children: Secondary | ICD-10-CM | POA: Insufficient documentation

## 2013-11-22 DIAGNOSIS — M5137 Other intervertebral disc degeneration, lumbosacral region: Secondary | ICD-10-CM | POA: Insufficient documentation

## 2013-11-22 DIAGNOSIS — Q762 Congenital spondylolisthesis: Secondary | ICD-10-CM

## 2013-11-22 MED ORDER — GABAPENTIN 100 MG PO CAPS: 100.0000 mg | ORAL_CAPSULE | Freq: Three times a day (TID) | ORAL | Status: DC

## 2013-11-22 MED ORDER — VENLAFAXINE HCL ER 37.5 MG PO CP24: 37.5000 mg | ORAL_CAPSULE | Freq: Every day | ORAL | Status: DC

## 2013-11-22 NOTE — Progress Notes (Signed)
Subjective:    Patient ID: Gary Trevino, male    DOB: Oct 04, 1955, 58 y.o.   MRN: [id number]  HPI Numbness in ulnar nerve distribution BUE.  Denies leaning on elbows, hx neck surgery started after then Low back pain with occ pain shooting into legs Has been out of venlafaxine. Has noted some increased anxiety. Last dose about 3 days ago  No longer taking tramadol. Does not feel like this is very effective. Continues on gabapentin 100 mg 3 times a day Pain Inventory Average Pain 8 Pain Right Now 9 My pain is sharp, burning, dull, stabbing, tingling and aching  In the last 24 hours, has pain interfered with the following? General activity 2 Relation with others 0 Enjoyment of life 3 What TIME of day is your pain at its worst? all Sleep (in general) Fair  Pain is worse with: walking, bending, sitting, inactivity, standing and some activites Pain improves with: medication and injections Relief from Meds: 1  Mobility use a cane how many minutes can you walk? 60 ability to climb steps?  no do you drive?  yes  Function disabled: date disabled 7/07  Neuro/Psych weakness numbness tremor tingling trouble walking spasms dizziness confusion depression anxiety  Prior Studies Any changes since last visit?  no  Physicians involved in your care Any changes since last visit?  no   Family History  Problem Relation Age of Onset  . Coronary artery disease      family Hx  . Cancer      family Hx  . Heart disease Mother   . Cancer Father    History   Social History  . Marital Status: Divorced    Spouse Name: N/A    Number of Children: N/A  . Years of Education: N/A   Social History Main Topics  . Smoking status: Former Smoker -- 3.50 packs/day for 25 years    Types: Cigarettes, Cigars    Quit date: 08/15/2006  . Smokeless tobacco: None  . Alcohol Use: No  . Drug Use: No     Comment: last-2007  . Sexual Activity: None   Other Topics Concern  . None    Social History Narrative   Divorced. Disabled. Does get regular exercise.    Past Surgical History  Procedure Laterality Date  . Spine surgery  [id number]    2 ACDF   Past Medical History  Diagnosis Date  . CAD (coronary artery disease), native coronary artery   . Unspecified essential hypertension   . Other and unspecified hyperlipidemia   . Morbid obesity   . COPD (chronic obstructive pulmonary disease)   . OSA (obstructive sleep apnea)   . Arthritis    BP 153/86  Pulse 60  Resp 14  Wt 338 lb (153.316 kg)  SpO2 98%  Opioid Risk Score:   Fall Risk Score: Moderate Fall Risk (6-13 points) (educated and fall prevention in the home handout given) Review of Systems  Respiratory: Positive for apnea and shortness of breath.   Gastrointestinal: Positive for constipation.  Musculoskeletal: Positive for back pain.       Spasms  Neurological: Positive for dizziness, tremors, weakness and numbness.       Tingling  Psychiatric/Behavioral: Positive for dysphoric mood. The patient is nervous/anxious.   All other systems reviewed and are negative.      Objective:   Physical Exam  Nursing note and vitals reviewed. Constitutional: He is oriented to person, place, and time. He appears well-developed and well-nourished.  HENT:  Head: Normocephalic and atraumatic.  Eyes: Conjunctivae and EOM are normal. Pupils are equal, round, and reactive to light.  Neck: Normal range of motion.  Neurological: He is alert and oriented to person, place, and time. He has normal strength. Gait abnormal.  Antalgic  Psychiatric: He has a normal mood and affect.   Obese       Assessment & Plan:  1. Chronic lumbar radiculitis, has had some relief with epidural injections but needed. This for financial reasons. Will continue gabapentin 100 about 3 times a day. We'll continue venlafaxine 37.5 mg XR daily, this can be increased if needed  Return to clinic 6 months Many schedule epidural injection  as needed

## 2013-11-22 NOTE — Progress Notes (Signed)
Has not been seen for >14mo

## 2013-11-22 NOTE — Patient Instructions (Addendum)
Please call if you would like to schedule an epidural steroid injection

## 2013-11-27 ENCOUNTER — Other Ambulatory Visit: Payer: Self-pay

## 2013-11-27 MED ORDER — GABAPENTIN 100 MG PO CAPS: 100.0000 mg | ORAL_CAPSULE | Freq: Three times a day (TID) | ORAL | Status: DC

## 2013-11-27 MED ORDER — VENLAFAXINE HCL ER 37.5 MG PO CP24: 37.5000 mg | ORAL_CAPSULE | Freq: Every day | ORAL | Status: DC

## 2014-01-01 ENCOUNTER — Telehealth: Payer: Self-pay | Admitting: *Deleted

## 2014-01-01 MED ORDER — VENLAFAXINE HCL 37.5 MG PO TABS: 37.5000 mg | ORAL_TABLET | Freq: Two times a day (BID) | ORAL | Status: DC

## 2014-01-01 NOTE — Telephone Encounter (Signed)
Gary Trevino called and said that he was feeling more anxious and panic attacks--more angry.  He was previously on Effexor bid and was cut back to daily.  (effexor 37.5 bid to effexor XR 37.5 daily) He is wondering if this might be the cause.  Please advise.

## 2014-01-01 NOTE — Telephone Encounter (Signed)
This may be the cause, please increase Effexor 37.5 twice a day #60

## 2014-01-01 NOTE — Telephone Encounter (Signed)
Left message for patient to call office regarding his effexor direction changes.  Per Dr Letta Pate he can increase his effexor back to 37.5 bid.

## 2014-01-01 NOTE — Telephone Encounter (Deleted)
Gary Trevino is on the XR Effexor.  It looks like he was on the regular Effexor bid.  Do you want [place] to switch him back to the regular effexor? Or can he take the XR bid?

## 2014-02-07 ENCOUNTER — Ambulatory Visit: Payer: Self-pay | Admitting: Unknown Physician Specialty

## 2014-02-13 LAB — PATHOLOGY REPORT

## 2014-05-24 ENCOUNTER — Ambulatory Visit: Payer: Medicare Other | Admitting: Physical Medicine & Rehabilitation

## 2014-06-03 ENCOUNTER — Encounter: Payer: Medicare Other | Attending: Physical Medicine & Rehabilitation

## 2014-06-03 ENCOUNTER — Encounter: Payer: Self-pay | Admitting: Physical Medicine & Rehabilitation

## 2014-06-03 ENCOUNTER — Ambulatory Visit (HOSPITAL_BASED_OUTPATIENT_CLINIC_OR_DEPARTMENT_OTHER): Payer: Medicare Other | Admitting: Physical Medicine & Rehabilitation

## 2014-06-03 VITALS — BP 137/86 | HR 70 | Resp 14 | Wt 351.6 lb

## 2014-06-03 DIAGNOSIS — I2510 Atherosclerotic heart disease of native coronary artery without angina pectoris: Secondary | ICD-10-CM | POA: Diagnosis not present

## 2014-06-03 DIAGNOSIS — J449 Chronic obstructive pulmonary disease, unspecified: Secondary | ICD-10-CM | POA: Diagnosis not present

## 2014-06-03 DIAGNOSIS — M5416 Radiculopathy, lumbar region: Secondary | ICD-10-CM | POA: Insufficient documentation

## 2014-06-03 DIAGNOSIS — M5137 Other intervertebral disc degeneration, lumbosacral region: Secondary | ICD-10-CM | POA: Insufficient documentation

## 2014-06-03 DIAGNOSIS — M4316 Spondylolisthesis, lumbar region: Secondary | ICD-10-CM | POA: Diagnosis not present

## 2014-06-03 DIAGNOSIS — Z87891 Personal history of nicotine dependence: Secondary | ICD-10-CM | POA: Insufficient documentation

## 2014-06-03 DIAGNOSIS — M1990 Unspecified osteoarthritis, unspecified site: Secondary | ICD-10-CM | POA: Diagnosis not present

## 2014-06-03 MED ORDER — TRAMADOL HCL 50 MG PO TABS: 100.0000 mg | ORAL_TABLET | Freq: Four times a day (QID) | ORAL | Status: DC

## 2014-06-03 MED ORDER — VENLAFAXINE HCL 37.5 MG PO TABS: 37.5000 mg | ORAL_TABLET | Freq: Two times a day (BID) | ORAL | Status: DC

## 2014-06-03 MED ORDER — CYCLOBENZAPRINE HCL 7.5 MG PO TABS: 7.5000 mg | ORAL_TABLET | Freq: Two times a day (BID) | ORAL | Status: DC | PRN

## 2014-06-03 NOTE — Progress Notes (Signed)
Subjective:    Patient ID: Gary W Hussam Muniz., male    DOB: Dec 19, 1955, 58 y.o.   MRN: [id number]  HPI  Chief complaint: Low back pain with radiation into both legs.Patient wondering if tramadol can be increased Chronic low back pain. Has been receiving epidural injections left L3-L4 paramedian however no injections for about a year. Has remained on maximum dose tramadol  Also on Effexor or. No signs of serotonin syndrome. Pain Inventory Average Pain 8 Pain Right Now 9 My pain is constant, sharp, burning, dull, stabbing, tingling and aching  In the last 24 hours, has pain interfered with the following? General activity 8 Relation with others 10 Enjoyment of life 10 What TIME of day is your pain at its worst? all Sleep (in general) NA  Pain is worse with: walking, bending, sitting, inactivity, standing and some activites Pain improves with: injections Relief from Meds: 2  Mobility use a cane how many minutes can you walk? 45 do you drive?  yes  Function disabled: date disabled 2007  Neuro/Psych weakness numbness tremor tingling trouble walking spasms dizziness confusion depression anxiety  Prior Studies Any changes since last visit?  no  Physicians involved in your care Any changes since last visit?  no   Family History  Problem Relation Age of Onset  . Coronary artery disease      family Hx  . Cancer      family Hx  . Heart disease Mother   . Cancer Father    History   Social History  . Marital Status: Single    Spouse Name: N/A    Number of Children: N/A  . Years of Education: N/A   Social History Main Topics  . Smoking status: Former Smoker -- 3.50 packs/day for 25 years    Types: Cigarettes, Cigars    Quit date: 08/15/2006  . Smokeless tobacco: None  . Alcohol Use: No  . Drug Use: No     Comment: last-2007  . Sexual Activity: None   Other Topics Concern  . None   Social History Narrative   Divorced. Disabled. Does get regular  exercise.    Past Surgical History  Procedure Laterality Date  . Spine surgery  0932,3557    2 ACDF   Past Medical History  Diagnosis Date  . CAD (coronary artery disease), native coronary artery   . Unspecified essential hypertension   . Other and unspecified hyperlipidemia   . Morbid obesity   . COPD (chronic obstructive pulmonary disease)   . OSA (obstructive sleep apnea)   . Arthritis    BP 137/86 mmHg  Pulse 70  Resp 14  Wt 351 lb 9.6 oz (159.485 kg)  SpO2 95%  Opioid Risk Score:   Fall Risk Score: Moderate Fall Risk (6-13 points) (previoulsy educated and given handout)  Review of Systems  Respiratory: Positive for apnea and shortness of breath.   Gastrointestinal: Positive for constipation.  Musculoskeletal: Positive for gait problem.       Spasms  Neurological: Positive for dizziness, tremors, weakness and numbness.       Tingling  Psychiatric/Behavioral: Positive for confusion and dysphoric mood. The patient is nervous/anxious.   All other systems reviewed and are negative.      Objective:   Physical Exam  Constitutional: He is oriented to person, place, and time. He appears well-developed and well-nourished.  HENT:  Head: Normocephalic and atraumatic.  Eyes: Conjunctivae and EOM are normal. Pupils are equal, round, and reactive to light.  Musculoskeletal:       Lumbar back: He exhibits decreased range of motion. He exhibits no tenderness, no bony tenderness and no deformity.  Neurological: He is alert and oriented to person, place, and time. He has normal strength. No sensory deficit. Gait normal.  Reflex Scores:      Patellar reflexes are 2+ on the right side and 2+ on the left side.      Achilles reflexes are 2+ on the right side and 2+ on the left side. 5/5 bilateral hip flexor and knee extensor and ankle dorsiflexor and plantar flexor  Negative straight leg raising  Psychiatric: He has a normal mood and affect.  Nursing note and vitals  reviewed.         Assessment & Plan:  1.Chronic low back pain secondary to lumbar degenerative disc with chronic radiculitis mainly left lower extremity Has had very good relief over the years with epidural injections left L3-L4 but has not had one for over a year. We will reschedule. Continue tramadol 2 tablets 4 times per day maximum dosage Continue Flexeril but increased to 7.5 twice a day Continue venlafaxine 37.5 mg twice a day this is to help with anxiety as well as pain No signs of serotonin syndrome  Advised weight loss

## 2014-06-03 NOTE — Patient Instructions (Signed)
The new muscle relaxer is a little stronger  Need to get back on schedule for [facility]

## 2014-06-18 ENCOUNTER — Other Ambulatory Visit: Payer: Self-pay | Admitting: Physical Medicine & Rehabilitation

## 2014-07-01 ENCOUNTER — Telehealth: Payer: Self-pay | Admitting: *Deleted

## 2014-07-01 NOTE — Telephone Encounter (Signed)
Approval received from insurance for Venlfaxine HCL Capsules

## 2014-07-04 ENCOUNTER — Ambulatory Visit: Payer: Self-pay

## 2014-07-04 ENCOUNTER — Ambulatory Visit: Payer: Medicare Other | Admitting: Physical Medicine & Rehabilitation

## 2014-07-25 ENCOUNTER — Ambulatory Visit (HOSPITAL_BASED_OUTPATIENT_CLINIC_OR_DEPARTMENT_OTHER): Payer: Medicare Other | Admitting: Physical Medicine & Rehabilitation

## 2014-07-25 ENCOUNTER — Encounter: Payer: Self-pay | Admitting: Physical Medicine & Rehabilitation

## 2014-07-25 ENCOUNTER — Encounter: Payer: Medicare Other | Attending: Physical Medicine & Rehabilitation

## 2014-07-25 VITALS — BP 111/80 | HR 74 | Resp 14

## 2014-07-25 DIAGNOSIS — I2510 Atherosclerotic heart disease of native coronary artery without angina pectoris: Secondary | ICD-10-CM | POA: Diagnosis not present

## 2014-07-25 DIAGNOSIS — M1990 Unspecified osteoarthritis, unspecified site: Secondary | ICD-10-CM | POA: Diagnosis not present

## 2014-07-25 DIAGNOSIS — M5416 Radiculopathy, lumbar region: Secondary | ICD-10-CM | POA: Diagnosis not present

## 2014-07-25 DIAGNOSIS — J449 Chronic obstructive pulmonary disease, unspecified: Secondary | ICD-10-CM | POA: Insufficient documentation

## 2014-07-25 DIAGNOSIS — Z87891 Personal history of nicotine dependence: Secondary | ICD-10-CM | POA: Insufficient documentation

## 2014-07-25 DIAGNOSIS — M4316 Spondylolisthesis, lumbar region: Secondary | ICD-10-CM | POA: Diagnosis not present

## 2014-07-25 DIAGNOSIS — M5137 Other intervertebral disc degeneration, lumbosacral region: Secondary | ICD-10-CM | POA: Insufficient documentation

## 2014-07-25 MED ORDER — TIZANIDINE HCL 2 MG PO TABS: 4.0000 mg | ORAL_TABLET | Freq: Three times a day (TID) | ORAL | Status: DC | PRN

## 2014-07-25 NOTE — Patient Instructions (Signed)

## 2014-07-25 NOTE — Progress Notes (Signed)
  PROCEDURE RECORD [facility]   Name: Gary Trevino DOB:Aug 17, 1955 MRN: [id number]  Date:07/25/2014  Physician: Alysia Penna, MD    Nurse/CMA: Wenda Overland, RN  Allergies: No Known Allergies  Consent Signed: Yes.    Is patient diabetic? No.  CBG today?   Pregnant: No. LMP: No LMP for male patient. (age 60-55)  Anticoagulants: no Anti-inflammatory: no Antibiotics: no  Procedure: Trans Lumbar ESI L2-3  Position: Prone   Start Time: 11:28  End Time: 11:46  Fluoro Time: 57 sec  RN/CMA Mancel Parsons Shumaker RN    Time 11:05 AM 11:52    BP 111/80 139/82    Pulse 74 78    Respirations 14 14    O2 Sat 95 99    S/S 6 6    Pain Level 7/10 6/10     D/C home with Lexine Baton (daughter), patient A & O X 3, D/C instructions reviewed, and sits independently.

## 2014-07-25 NOTE — Progress Notes (Signed)
Lumbar epidural steroid injection under fluoroscopic guidance, Left L2-3 Trans laminar  Indication: Lumbosacral radiculitis is not relieved by medication management or other conservative care and interfering with self-care and mobility.  Informed consent was obtained after describing risk and benefits of the procedure with the patient, this includes bleeding, bruising, infection, paralysis and medication side effects.  The patient wishes to proceed and has given written consent.  Patient was placed in a prone position.  The lumbar area was marked and prepped with Betadine.  It was entered with a 25-gauge 1-1/2 inch needle and one mL of 1% lidocaine was injected into the skin and subcutaneous tissue.  Then a 18-gauge 6 inch spinal needle was inserted under fluoroscopic guidance into the Left L3-4 interlaminar space under AP and Lateral imaging. Couldn't get LOR after 3 passes due to bone contact superficial to epidural space, then Left L2-3 targeted with 17g Tuohy .  Once needle tip of approximated the posterior elements, a loss of resistance technique was utilized with lateral imaging.  A positive loss of resistance was obtained and then confirmed by injecting 2 mL's of Omnipaque 180.  Then a solution containing 1.5 mL's of 6mg /cc celestone and 2 mL's of 1% lidocaine was injected.  The patient tolerated procedure well.  Post procedure instructions were given.  Please see post procedure form.

## 2014-10-07 ENCOUNTER — Telehealth: Payer: Self-pay | Admitting: *Deleted

## 2014-10-07 NOTE — Telephone Encounter (Signed)
Estill Bamberg called from [facility] and Gary Trevino is taking venlafaxine 37.5 mg bid and it is $44.  She is saying if he can have 75 mg tablet and take 1/2 bid it will drop to $19.  I know money has been an issue for him.  Would this be ok?

## 2014-10-07 NOTE — Telephone Encounter (Signed)
Sure as long as it is not Controlled release

## 2014-10-08 ENCOUNTER — Other Ambulatory Visit: Payer: Self-pay | Admitting: Physical Medicine & Rehabilitation

## 2014-10-08 MED ORDER — VENLAFAXINE HCL 75 MG PO TABS: 37.5000 mg | ORAL_TABLET | Freq: Two times a day (BID) | ORAL | Status: DC

## 2014-10-08 NOTE — Telephone Encounter (Signed)
Reordered electronically venlafaxine 75 mg 1/2 tab bid due to it being cheaper than the 37.5 mg tab bid per pharmacist.

## 2014-10-24 ENCOUNTER — Ambulatory Visit: Payer: Medicare Other | Admitting: Physical Medicine & Rehabilitation

## 2014-11-01 ENCOUNTER — Other Ambulatory Visit: Payer: Self-pay | Admitting: *Deleted

## 2014-11-01 MED ORDER — VENLAFAXINE HCL 75 MG PO TABS: 37.5000 mg | ORAL_TABLET | Freq: Two times a day (BID) | ORAL | Status: DC

## 2014-12-06 ENCOUNTER — Other Ambulatory Visit: Payer: Self-pay | Admitting: Physical Medicine & Rehabilitation

## 2015-01-01 ENCOUNTER — Other Ambulatory Visit: Payer: Self-pay | Admitting: Physical Medicine & Rehabilitation

## 2015-01-16 ENCOUNTER — Encounter: Payer: Medicare Other | Attending: Physical Medicine & Rehabilitation

## 2015-01-16 ENCOUNTER — Encounter: Payer: Self-pay | Admitting: Physical Medicine & Rehabilitation

## 2015-01-16 ENCOUNTER — Ambulatory Visit (HOSPITAL_BASED_OUTPATIENT_CLINIC_OR_DEPARTMENT_OTHER): Payer: Medicare Other | Admitting: Physical Medicine & Rehabilitation

## 2015-01-16 VITALS — BP 105/66 | HR 71 | Resp 16

## 2015-01-16 DIAGNOSIS — M5416 Radiculopathy, lumbar region: Secondary | ICD-10-CM

## 2015-01-16 DIAGNOSIS — M5417 Radiculopathy, lumbosacral region: Secondary | ICD-10-CM | POA: Diagnosis not present

## 2015-01-16 MED ORDER — TIZANIDINE HCL 2 MG PO TABS: ORAL_TABLET | ORAL | Status: DC

## 2015-01-16 MED ORDER — VENLAFAXINE HCL 75 MG PO TABS: 37.5000 mg | ORAL_TABLET | Freq: Two times a day (BID) | ORAL | Status: DC

## 2015-01-16 MED ORDER — TRAMADOL HCL 50 MG PO TABS: 100.0000 mg | ORAL_TABLET | Freq: Four times a day (QID) | ORAL | Status: DC

## 2015-01-16 NOTE — Progress Notes (Signed)
  PROCEDURE RECORD [facility]   Name: Gary Trevino DOB:02-Aug-1955 MRN: [id number]  Date:01/16/2015  Physician: Alysia Penna, MD    Nurse/CMA: Mancel Parsons  Allergies: No Known Allergies  Consent Signed: Yes.    Is patient diabetic? No.  CBG today?   Pregnant: No. LMP: No LMP for male patient. (age 60-55)  Anticoagulants: no Anti-inflammatory: no Antibiotics: no  Procedure: translaminar epidural steroid injection Position: Prone Start Time: 1:49 pm  End Time: 1:55 pm  Fluoro Time: 18  RN/CMA Rolan Bucco Ayanah Snader    Time 1:15 pm 2:00 pm    BP 105/66 145/85    Pulse 71 63    Respirations 16 16    O2 Sat 96 99    S/S 6 6    Pain Level 8/10 7/10     D/C home with daughter, patient A & O X 3, D/C instructions reviewed, and sits independently.

## 2015-01-16 NOTE — Progress Notes (Signed)
Lumbar Left L2-3 epidural steroid injection under fluoroscopic guidance, Left L2-3 Trans laminar  Indication: Lumbosacral radiculitis is not relieved by medication management or other conservative care and interfering with self-care and mobility.  Informed consent was obtained after describing risk and benefits of the procedure with the patient, this includes bleeding, bruising, infection, paralysis and medication side effects.  The patient wishes to proceed and has given written consent.  Patient was placed in a prone position.  The lumbar area was marked and prepped with Betadine.  It was entered with a 25-gauge 1-1/2 inch needle and one mL of 1% lidocaine was injected into the skin and subcutaneous tissue.  Then a 18-gauge 6 inch spinal needle was inserted under fluoroscopic guidance into the Left L2-3 interlaminar space,  .  Once needle tip of approximated the posterior elements, a loss of resistance technique was utilized with lateral imaging.  A positive loss of resistance was obtained and then confirmed by injecting 2 mL's of Omnipaque 180.  Then a solution containing 1.5 mL's of 6mg /cc celestone and 2 mL's of 1% lidocaine was injected.  The patient tolerated procedure well.  Post procedure instructions were given.  Please see post procedure form.

## 2015-01-16 NOTE — Patient Instructions (Signed)

## 2015-04-17 ENCOUNTER — Ambulatory Visit (HOSPITAL_BASED_OUTPATIENT_CLINIC_OR_DEPARTMENT_OTHER): Payer: Medicare Other | Admitting: Physical Medicine & Rehabilitation

## 2015-04-17 ENCOUNTER — Encounter: Payer: Medicare Other | Attending: Physical Medicine & Rehabilitation

## 2015-04-17 ENCOUNTER — Encounter: Payer: Self-pay | Admitting: Physical Medicine & Rehabilitation

## 2015-04-17 VITALS — BP 102/64 | HR 72 | Resp 14

## 2015-04-17 DIAGNOSIS — M5417 Radiculopathy, lumbosacral region: Secondary | ICD-10-CM | POA: Diagnosis not present

## 2015-04-17 DIAGNOSIS — M5416 Radiculopathy, lumbar region: Secondary | ICD-10-CM | POA: Diagnosis not present

## 2015-04-17 MED ORDER — VENLAFAXINE HCL 75 MG PO TABS: 37.5000 mg | ORAL_TABLET | Freq: Two times a day (BID) | ORAL | Status: DC

## 2015-04-17 MED ORDER — TIZANIDINE HCL 2 MG PO TABS: ORAL_TABLET | ORAL | Status: DC

## 2015-04-17 NOTE — Progress Notes (Addendum)
  PROCEDURE RECORD [facility]   Name: Gary Trevino DOB:Apr 18, 1956 MRN: [id number]  Date:04/17/2015  Physician: Alysia Penna, MD    Nurse/CMA: Mancel Parsons  Allergies: No Known Allergies  Consent Signed: Yes.    Is patient diabetic? No.  CBG today?   Pregnant: No. LMP: No LMP for male patient. (age 60-55)  Anticoagulants: no Anti-inflammatory: no Antibiotics: no  Procedure: translaminar epidural steroid injection Position: Prone Start Time: 11:19am  End Time: 11:25  Fluoro Time: 21   RN/CMA Rolan Bucco Filip Luten    Time 11:00 am 11:30am    BP 102/64 135/70    Pulse 72 73    Respirations 14 14    O2 Sat 95 97    S/S 6 6    Pain Level 8/10 7/10     D/C home with daughter, patient A & O X 3, D/C instructions reviewed, and sits independently.

## 2015-04-17 NOTE — Patient Instructions (Signed)

## 2015-04-17 NOTE — Progress Notes (Addendum)
Left L3-4 Lumbar transforaminal epidural steroid injection under fluoroscopic guidance  Indication: Lumbosacral radiculitis is not relieved by medication management or other conservative care and interfering with self-care and mobility. He has evidence of left L3 nerve root impingement on MRI, did not have his usual good relief with the L2-L3 translaminar injection 3 months ago.  Informed consent was obtained after describing risk and benefits of the procedure with the patient, this includes bleeding, bruising, infection, paralysis and medication side effects.  The patient wishes to proceed and has given written consent.  Patient was placed in prone position.  The lumbar area was marked and prepped with Betadine.  It was entered with a 25-gauge 1-1/2 inch needle and one mL of 1% lidocaine was injected into the skin and subcutaneous tissue.  Then a 22-gauge 7in spinal needle was inserted into the Left L3-4 intervertebral foramen under AP, lateral, and oblique view.  Then a solution containing one mL of 10 mg per mL dexamethasone and 2 mL of 1% lidocaine was injected.  The patient tolerated procedure well.  Post procedure instructions were given.  Please see post procedure form.

## 2015-05-22 ENCOUNTER — Other Ambulatory Visit: Payer: Self-pay | Admitting: Physical Medicine & Rehabilitation

## 2015-07-18 ENCOUNTER — Ambulatory Visit: Payer: Medicare Other | Admitting: Physical Medicine & Rehabilitation

## 2015-07-21 ENCOUNTER — Other Ambulatory Visit: Payer: Self-pay | Admitting: Physical Medicine & Rehabilitation

## 2015-07-21 NOTE — Telephone Encounter (Signed)
Is this okay to refill? Last documentation is from April 2016. Thanks!

## 2015-09-06 ENCOUNTER — Other Ambulatory Visit: Payer: Self-pay | Admitting: Physical Medicine & Rehabilitation

## 2015-10-19 ENCOUNTER — Other Ambulatory Visit: Payer: Self-pay | Admitting: Physical Medicine & Rehabilitation

## 2015-10-20 NOTE — Telephone Encounter (Signed)
We rec'd an electronic request for effexor refill.  Patients last visit was 04/17/2015 for an injection procedure.  The last fill date on the medication was 07/18/2015.  The patient also cancelled an appt on that date.   He does have an appt scheduled for 10/28/2015.Gary KitchenMarland KitchenMarland KitchenMarland Kitchenplease advise

## 2015-10-28 ENCOUNTER — Encounter: Payer: Medicare Other | Attending: Physical Medicine & Rehabilitation

## 2015-10-28 ENCOUNTER — Ambulatory Visit (HOSPITAL_BASED_OUTPATIENT_CLINIC_OR_DEPARTMENT_OTHER): Payer: Medicare Other | Admitting: Physical Medicine & Rehabilitation

## 2015-10-28 ENCOUNTER — Encounter: Payer: Self-pay | Admitting: Physical Medicine & Rehabilitation

## 2015-10-28 VITALS — BP 127/70 | HR 70 | Resp 14

## 2015-10-28 DIAGNOSIS — M5416 Radiculopathy, lumbar region: Secondary | ICD-10-CM | POA: Diagnosis not present

## 2015-10-28 MED ORDER — TRAMADOL HCL 50 MG PO TABS: 100.0000 mg | ORAL_TABLET | Freq: Four times a day (QID) | ORAL | Status: DC

## 2015-10-28 MED ORDER — GABAPENTIN 100 MG PO CAPS: 100.0000 mg | ORAL_CAPSULE | Freq: Three times a day (TID) | ORAL | Status: AC

## 2015-10-28 NOTE — Progress Notes (Signed)
  PROCEDURE RECORD [facility]   Name: Gary Trevino DOB:November 27, 1955 MRN: [id number]  Date:10/28/2015  Physician: Alysia Penna, MD    Nurse/CMA: Mancel Parsons, CMA  Allergies: No Known Allergies  Consent Signed: Yes.    Is patient diabetic? No.  CBG today? N/A  Pregnant: No. LMP: No LMP for male patient. (age 61-55)  Anticoagulants: no Anti-inflammatory: no Antibiotics: no  Procedure: translaminar epidural steroid injection  Position: Prone Start Time: 10:28am  End Time: 10:30am  Fluoro Time: 22  RN/CMA Jeryl Wilbourn, CMA Jaze Rodino, CMA    Time 10:06am 10:35am    BP 127/70 135/78    Pulse 70 72    Respirations 14 14    O2 Sat 95 97    S/S 6 6    Pain Level 7/10 7/10     D/C home with brother, patient A & O X 3, D/C instructions reviewed, and sits independently.

## 2015-10-28 NOTE — Patient Instructions (Signed)

## 2015-11-14 ENCOUNTER — Other Ambulatory Visit: Payer: Self-pay | Admitting: Physical Medicine & Rehabilitation

## 2015-11-25 ENCOUNTER — Encounter: Payer: Self-pay | Admitting: Pain Medicine

## 2015-11-25 ENCOUNTER — Ambulatory Visit: Payer: Medicare Other | Attending: Pain Medicine | Admitting: Pain Medicine

## 2015-11-25 ENCOUNTER — Telehealth: Payer: Self-pay | Admitting: Pain Medicine

## 2015-11-25 VITALS — BP 117/68 | HR 70 | Temp 97.7°F | Resp 18 | Ht 69.0 in | Wt 350.0 lb

## 2015-11-25 DIAGNOSIS — M51369 Other intervertebral disc degeneration, lumbar region without mention of lumbar back pain or lower extremity pain: Secondary | ICD-10-CM | POA: Insufficient documentation

## 2015-11-25 DIAGNOSIS — M47816 Spondylosis without myelopathy or radiculopathy, lumbar region: Secondary | ICD-10-CM | POA: Insufficient documentation

## 2015-11-25 DIAGNOSIS — M79601 Pain in right arm: Secondary | ICD-10-CM | POA: Diagnosis present

## 2015-11-25 DIAGNOSIS — M4806 Spinal stenosis, lumbar region: Secondary | ICD-10-CM | POA: Diagnosis not present

## 2015-11-25 DIAGNOSIS — R202 Paresthesia of skin: Secondary | ICD-10-CM | POA: Insufficient documentation

## 2015-11-25 DIAGNOSIS — F410 Panic disorder [episodic paroxysmal anxiety] without agoraphobia: Secondary | ICD-10-CM | POA: Insufficient documentation

## 2015-11-25 DIAGNOSIS — I10 Essential (primary) hypertension: Secondary | ICD-10-CM | POA: Insufficient documentation

## 2015-11-25 DIAGNOSIS — Z9889 Other specified postprocedural states: Secondary | ICD-10-CM | POA: Diagnosis not present

## 2015-11-25 DIAGNOSIS — F419 Anxiety disorder, unspecified: Secondary | ICD-10-CM | POA: Diagnosis not present

## 2015-11-25 DIAGNOSIS — M542 Cervicalgia: Secondary | ICD-10-CM | POA: Diagnosis present

## 2015-11-25 DIAGNOSIS — M4316 Spondylolisthesis, lumbar region: Secondary | ICD-10-CM | POA: Diagnosis not present

## 2015-11-25 DIAGNOSIS — M79602 Pain in left arm: Secondary | ICD-10-CM | POA: Diagnosis present

## 2015-11-25 DIAGNOSIS — M533 Sacrococcygeal disorders, not elsewhere classified: Secondary | ICD-10-CM | POA: Insufficient documentation

## 2015-11-25 DIAGNOSIS — M5136 Other intervertebral disc degeneration, lumbar region: Secondary | ICD-10-CM | POA: Insufficient documentation

## 2015-11-25 DIAGNOSIS — M5126 Other intervertebral disc displacement, lumbar region: Secondary | ICD-10-CM | POA: Diagnosis not present

## 2015-11-25 DIAGNOSIS — J45909 Unspecified asthma, uncomplicated: Secondary | ICD-10-CM | POA: Diagnosis not present

## 2015-11-25 DIAGNOSIS — M48062 Spinal stenosis, lumbar region with neurogenic claudication: Secondary | ICD-10-CM | POA: Insufficient documentation

## 2015-11-25 DIAGNOSIS — M5430 Sciatica, unspecified side: Secondary | ICD-10-CM | POA: Insufficient documentation

## 2015-11-25 NOTE — Progress Notes (Signed)
Safety precautions to be maintained throughout the outpatient stay will include: orient to surroundings, keep bed in low position, maintain call bell within reach at all times, provide assistance with transfer out of bed and ambulation.  Clearance to have LESI faxed to Dr. Lisette Grinder. Permission to prescribe pain medications faxed to Dr. Alysia Penna.

## 2015-11-25 NOTE — Progress Notes (Signed)
Subjective:    Patient ID: Gary Trevino., male    DOB: 11/21/1955, 60 y.o.   MRN: [id number]  HPI  The patient is a 60 year old gentleman who comes to pain management at the request of Dr. Lisette Grinder III for further evaluation and treatment of pain involving the neck upper extremity region and lower back and lower extremity region. The patient is status post surgical intervention of the cervical region 2 in 2007 and 2008 performed by Dr. Archie Patten the patient is with pain involving the lower back and lower extremity region predominantly. The patient states that his pain is aching agonizing annoying pain associated with sharp shooting stabbing symptoms tingling and uncomfortable sensations that his deep disabling and old sensations as well the pain associated with tingling sensations spasms numbness with inability to concentrate as well as impotence. Patient states he is not head 6 for several years. The patient states his pain is increased with bending kneeling lifting sitting standing squatting stooping walking. The patient states his pain is improved in the past with injection. We discussed patient's condition. Patient is undergone prior injection by Dr.Kirsten area the patient stated that he wishes to transfer his care to palpation management facility for further evaluation and treatment at this time. We discussed patient's condition and informed patient that we will consider patient for treatment including lumbar epidural steroid injection to be performed at time return appointment. We will also discussed surgical reevaluation and have discussed updating lumbar MRI as well. We may consider PNCV EMG studies and other studies for evaluation of pain involving neck upper extremity region as well as lower back and lower extremity regions. At the present time we will proceed with lumbar epidural steroid injection at time return appointment in attempt to decrease severity of symptoms, minimize progression  of symptoms, and avoid the need for more involved treatment. All agreed to suggested treatment plan    Review of Systems    Cardiovascular: High blood pressure   Pulmonary: Asthma Bronchitis  Neurological: Unremarkable  Psychological: Anxiety Depression Panic attacks  Gastrointestinal: Unremarkable  Genitourinary: Unremarkable  Hematologic: Unremarkable  Endocrine: Unremarkable  Rheumatological: Unremarkable  Musculoskeletal: Unremarkable  Other significant: Unremarkable      Objective:   Physical Exam   There was tenderness of the splenius capitis and occipitalis region palpation which reproduces pain of moderate degree with well-healed surgical scar of the left anterior cervical region and the posterior cervical region. There was tenderness of the acromial clavicular and glenohumeral joint regions of moderate degree and patient appeared to be with unremarkable Spurling's maneuver. Palpation of the trapezius levator scapula and rhomboid musculature regions reproduce moderate discomfort. The patient in today to be with slightly decreased grip strength with Tinel and Phalen's maneuver reproducing minimal discomfort. Palpation over the thoracic region was with no crepitus of the thoracic region noted. Palpation over the lumbar paraspinal muscular treat and lumbar facet region was attends to palpation of the lumbar region with lateral bending rotation and palpation of the lumbar facets reproducing moderate discomfort. There was moderate tenderness of the PSIS and PII S region as well as is mild tenderness of the greater trochanteric region iliotibial band region. There was moderate increase of pain with pressure applied to the ileum with patient in lateral decubitus position. Straight Leg raise was tolerates approximately 20 without a definite increased pain with dorsiflexion noted. The knees were attends to palpation in crepitus of the knees with negative anterior and  posterior drawer signs without ballottement  of the patella. EHL strength appeared to be slightly decreased. There was negative clonus negative Homans. Abdomen was nontender with no costovertebral tenderness noted     Assessment & Plan:   Degenerative disc disease of the lumbar spine  T12-L1: Negative.  L1-2: Mild facet joint degenerative changes.  L2-3: Minimal bulge slightly greater right lateral position. This touches but does not compress the right L2 nerve root. Mild bilateral facet joint degenerative changes.  L3-4: Bulge slightly greater left lateral position touching but not causing significant compression of the exiting left L3 nerve root. Mild to moderate facet joint degenerative changes. Ligamentum flavum hypertrophy greater on the left. Prominent epidural fat. Moderate thecal sac narrowing.  L4-5: Marked bilateral facet joint degenerative changes with bony overgrowth. Fluid within the degenerated left facet joint undermining facet joint hypertrophy with an appearance suggesting early synovial cyst formation. 6.8 mm anterior slip of L4. Moderate bulge. Bulging disc material greater left lateral position with slight encroachment upon the exiting left L4 nerve root. Multifactorial marked bilateral lateral recess stenosis slightly greater on the left. Multifactorial moderate to marked thecal sac narrowing.  L5-S1: Moderate bilateral facet joint degenerative changes greater on the right. Bulge. Mild bilateral foraminal narrowing.  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  Lumbar facet syndrome  Lumbar stenosis with neurogenic claudication  Spondylolisthesis L4-L5  Degenerative disc disease cervical spine  Cervical facet syndrome  Status post surgery of cervical region times two  Sacroiliac joint dysfunction     PLAN  Continue present  medications  Lumbar epidural steroid injection to be performed at time return appointment pending medical clearance by Dr. Lisette Grinder third  F/U PCP Dr. Lisette Grinder III for evaliation of  BP and general medical  condition  F/U surgical evaluation. May consider pending follow-up evaluations  F/U neurological evaluation. May consider PNCV/EMG studies of the upper extremities and lower extremities as well as additional studies pending follow-up evaluations  Update of lumbar and cervical MRIs to be considered as discussed  May consider radiofrequency rhizolysis or intraspinal procedures pending response to present treatment and F/U evaluation   Patient to call Pain Management Center should patient have concerns prior to scheduled return appointment.

## 2015-11-25 NOTE — Telephone Encounter (Signed)
Lori  Please see whether patient wishes to continue his treatment We will be available to treat patient if patient wishes to continue his treatment with [place] Thank you

## 2015-11-25 NOTE — Patient Instructions (Addendum)
PLAN  Continue present medications  Lumbar epidural steroid injection to be performed at time return appointment pending medical clearance by Dr. Lisette Grinder third  F/U PCP Dr. Lisette Grinder III for evaliation of  BP and general medical  condition  F/U surgical evaluation. May consider pending follow-up evaluations  F/U neurological evaluation. May consider pending follow-up evaluations  May consider radiofrequency rhizolysis or intraspinal procedures pending response to present treatment and F/U evaluation   Patient to call Pain Management Center should patient have concerns prior to scheduled return appointment.

## 2015-11-25 NOTE — Telephone Encounter (Signed)
[facility] received request to write meds for Mr. Gary Trevino, they need to know if Dr. Primus Bravo is taking over care of Mr. Polson and would like to speak with Nurse [phone] or [phone]

## 2015-11-25 NOTE — Telephone Encounter (Signed)
Spoke with Lelan Pons at [facility] and [facility] management.  They want [place] to be aware that Mr Dambra is currently being treated there for medications and injections.  His last injection was Oct 28, 2015 with Dr Laury Axon and has a future appt scheduled in November.  They want to be sure if Mr Kehler is switching care before they give permission to prescribe and stop prescribing for him as well as stop giving injections.

## 2015-11-26 NOTE — Telephone Encounter (Signed)
Kori Thank you very much

## 2015-11-26 NOTE — Telephone Encounter (Signed)
Spoke with patient and he wishes to continue his care here with Dr Primus Bravo and not with Dr Laury Axon

## 2015-12-02 LAB — TOXASSURE SELECT 13 (MW), URINE: PDF: 0

## 2015-12-02 NOTE — Progress Notes (Signed)
Quick Note:  Reviewed. ______ 

## 2015-12-08 ENCOUNTER — Ambulatory Visit: Payer: Medicare Other | Attending: Pain Medicine | Admitting: Pain Medicine

## 2015-12-08 ENCOUNTER — Encounter: Payer: Self-pay | Admitting: Pain Medicine

## 2015-12-08 VITALS — BP 119/76 | HR 62 | Temp 97.6°F | Resp 15 | Ht 69.0 in | Wt 350.0 lb

## 2015-12-08 DIAGNOSIS — M47816 Spondylosis without myelopathy or radiculopathy, lumbar region: Secondary | ICD-10-CM

## 2015-12-08 DIAGNOSIS — M5430 Sciatica, unspecified side: Secondary | ICD-10-CM

## 2015-12-08 DIAGNOSIS — M48062 Spinal stenosis, lumbar region with neurogenic claudication: Secondary | ICD-10-CM

## 2015-12-08 DIAGNOSIS — M4806 Spinal stenosis, lumbar region: Secondary | ICD-10-CM | POA: Diagnosis not present

## 2015-12-08 DIAGNOSIS — M5136 Other intervertebral disc degeneration, lumbar region: Secondary | ICD-10-CM | POA: Diagnosis not present

## 2015-12-08 DIAGNOSIS — M533 Sacrococcygeal disorders, not elsewhere classified: Secondary | ICD-10-CM

## 2015-12-08 DIAGNOSIS — M5126 Other intervertebral disc displacement, lumbar region: Secondary | ICD-10-CM | POA: Insufficient documentation

## 2015-12-08 DIAGNOSIS — M79606 Pain in leg, unspecified: Secondary | ICD-10-CM | POA: Diagnosis present

## 2015-12-08 DIAGNOSIS — M545 Low back pain: Secondary | ICD-10-CM | POA: Diagnosis present

## 2015-12-08 DIAGNOSIS — Z9889 Other specified postprocedural states: Secondary | ICD-10-CM

## 2015-12-08 DIAGNOSIS — M51369 Other intervertebral disc degeneration, lumbar region without mention of lumbar back pain or lower extremity pain: Secondary | ICD-10-CM

## 2015-12-08 MED ORDER — CEFAZOLIN SODIUM 1-5 GM-% IV SOLN
1.0000 g | Freq: Once | INTRAVENOUS | Status: AC
  Administered 2015-12-08: 1 g via INTRAVENOUS

## 2015-12-08 MED ORDER — FENTANYL CITRATE (PF) 100 MCG/2ML IJ SOLN
100.0000 ug | Freq: Once | INTRAMUSCULAR | Status: DC
  Filled 2015-12-08: qty 2

## 2015-12-08 MED ORDER — ORPHENADRINE CITRATE 30 MG/ML IJ SOLN
60.0000 mg | Freq: Once | INTRAMUSCULAR | Status: AC
  Administered 2015-12-08: 60 mg via INTRAMUSCULAR
  Filled 2015-12-08: qty 2

## 2015-12-08 MED ORDER — LACTATED RINGERS IV SOLN
1000.0000 mL | INTRAVENOUS | Status: DC
  Administered 2015-12-08: 1000 mL via INTRAVENOUS

## 2015-12-08 MED ORDER — SODIUM CHLORIDE 0.9 % IJ SOLN
INTRAMUSCULAR | Status: AC
  Administered 2015-12-08: 11:00:00
  Filled 2015-12-08: qty 20

## 2015-12-08 MED ORDER — CEFAZOLIN SODIUM 1 G IJ SOLR
INTRAMUSCULAR | Status: AC
  Administered 2015-12-08: 10:00:00
  Filled 2015-12-08: qty 10

## 2015-12-08 MED ORDER — MIDAZOLAM HCL 5 MG/5ML IJ SOLN
5.0000 mg | Freq: Once | INTRAMUSCULAR | Status: DC
  Filled 2015-12-08: qty 5

## 2015-12-08 MED ORDER — BUPIVACAINE HCL (PF) 0.25 % IJ SOLN
30.0000 mL | Freq: Once | INTRAMUSCULAR | Status: AC
  Administered 2015-12-08: 30 mL
  Filled 2015-12-08: qty 30

## 2015-12-08 MED ORDER — TRIAMCINOLONE ACETONIDE 40 MG/ML IJ SUSP
40.0000 mg | Freq: Once | INTRAMUSCULAR | Status: AC
  Administered 2015-12-08: 40 mg
  Filled 2015-12-08: qty 1

## 2015-12-08 MED ORDER — CEFUROXIME AXETIL 250 MG PO TABS: 250.0000 mg | ORAL_TABLET | Freq: Two times a day (BID) | ORAL | Status: AC

## 2015-12-08 NOTE — Progress Notes (Signed)
Subjective:    Patient ID: Gary W Danell Sisk., male    DOB: 1955-11-04, 60 y.o.   MRN: [id number]  HPI  PROCEDURE PERFORMED: Lumbar epidural steroid injection   NOTE: The patient is a 60 y.o. male who returns to [place] for further evaluation and treatment of pain involving the lumbar and lower extremity region. MRI revealed degenerative disc disease of the lumbar spine  T12-L1: Negative.  L1-2: Mild facet joint degenerative changes.  L2-3: Minimal bulge slightly greater right lateral position. This touches but does not compress the right L2 nerve root. Mild bilateral facet joint degenerative changes.  L3-4: Bulge slightly greater left lateral position touching but not causing significant compression of the exiting left L3 nerve root. Mild to moderate facet joint degenerative changes. Ligamentum flavum hypertrophy greater on the left. Prominent epidural fat. Moderate thecal sac narrowing.  L4-5: Marked bilateral facet joint degenerative changes with bony overgrowth. Fluid within the degenerated left facet joint undermining facet joint hypertrophy with an appearance suggesting early synovial cyst formation. 6.8 mm anterior slip of L4. Moderate bulge. Bulging disc material greater left lateral position with slight encroachment upon the exiting left L4 nerve root. Multifactorial marked bilateral lateral recess stenosis slightly greater on the left. Multifactorial moderate to marked thecal sac narrowing.  L5-S1: Moderate bilateral facet joint degenerative changes greater on the right. Bulge. Mild bilateral foraminal narrowing.  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 There is concern regarding intraspinal abnormalities contributing to patient's symptomatology to significant degree. The patient appears to be with component of stenosis contributing to  lumbar stenosis with neurogenic claudication in addition to other abnormalities of the lumbar spine contributing to patient's symptomatology The risks, benefits, and expectations of the procedure have been discussed and explained to the patient who was understanding and in agreement with suggested treatment plan. We will proceed with lumbar epidural steroid injection as discussed and as explained to the patient who is willing to proceed with procedure as planned.   DESCRIPTION OF PROCEDURE: Lumbar epidural steroid injection with EKG, blood pressure, pulse, capnography, and pulse oximetry monitoring. The procedure was performed with the patient in the prone position under fluoroscopic guidance. A local anesthetic skin wheal of 1.5% plain lidocaine was accomplished at proposed entry site. An 18-gauge Tuohy epidural needle was inserted at the L 4 vertebral body level right of the midline via loss-of-resistance technique with negative heme and negative CSF return. A total of 4 mL of Preservative-Free normal saline with 40 mg of Kenalog injected incrementally via epidurally placed needle. Needle was removed.    A total of 40 mg of Kenalog was utilized for the procedure.   The patient tolerated the injection well.    PLAN:   1. Medications: We will continue presently prescribed medications. 2. Will consider modification of treatment regimen pending response to treatment rendered on today's visit and follow-up evaluation. 3. The patient is to follow-up with primary care physician Dr. Lisette Grinder III regarding blood pressure and general medical condition status post lumbar epidural steroid injection performed on today's visit. 4. Surgical evaluation. Has been addressed 5. Neurological evaluation. May consider [facility] EMG studies and other studies 6. The patient may be a candidate for radiofrequency procedures, implantation device, and other treatment pending response to treatment and follow-up  evaluation. 7. The patient has been advised to adhere to proper body mechanics and avoid activities which appear to aggravate condition. 8. The patient  has been advised to call the [facility] prior to scheduled return appointment should there be significant change in condition or should there be sign  The patient is understanding and agrees with the suggested  treatment plan   Review of Systems     Objective:   Physical Exam        Assessment & Plan:

## 2015-12-08 NOTE — Patient Instructions (Addendum)
PLAN  Continue present medications. Please obtain Ceftin antibiotic today and begin taking Ceftin antibiotic today as prescribed  F/U PCP Dr. Lisette Grinder III for evaliation of  BP and general medical  condition  F/U surgical evaluation. May consider pending follow-up evaluations  F/U neurological evaluation. May consider pending follow-up evaluations  May consider radiofrequency rhizolysis or intraspinal procedures pending response to present treatment and F/U evaluation   Patient to call Pain Management Center should patient have concerns prior to scheduled return appointment. Pain Management Discharge Instructions  General Discharge Instructions :  If you need to reach your doctor call: Monday-Friday 8:00 am - 4:00 pm at [phone] or toll free [phone].  After clinic hours [phone] to have operator reach doctor.  Bring all of your medication bottles to all your appointments in the pain clinic.  To cancel or reschedule your appointment with Pain Management please remember to call 24 hours in advance to avoid a fee.  Refer to the educational materials which you have been given on: General Risks, I had my Procedure. Discharge Instructions, Post Sedation.  Post Procedure Instructions:  The drugs you were given will stay in your system until tomorrow, so for the next 24 hours you should not drive, make any legal decisions or drink any alcoholic beverages.  You may eat anything you prefer, but it is better to start with liquids then soups and crackers, and gradually work up to solid foods.  Please notify your doctor immediately if you have any unusual bleeding, trouble breathing or pain that is not related to your normal pain.  Depending on the type of procedure that was done, some parts of your body may feel week and/or numb.  This usually clears up by tonight or the next day.  Walk with the use of an assistive device or accompanied by an adult for the 24 hours.  You may  use ice on the affected area for the first 24 hours.  Put ice in a Ziploc bag and cover with a towel and place against area 15 minutes on 15 minutes off.  You may switch to heat after 24 hours.

## 2015-12-09 ENCOUNTER — Telehealth: Payer: Self-pay | Admitting: *Deleted

## 2015-12-09 NOTE — Telephone Encounter (Signed)
No problems post procedure. 

## 2016-01-01 ENCOUNTER — Encounter: Payer: Self-pay | Admitting: Pain Medicine

## 2016-01-01 ENCOUNTER — Ambulatory Visit: Payer: Medicare Other | Attending: Pain Medicine | Admitting: Pain Medicine

## 2016-01-01 VITALS — BP 103/72 | HR 80 | Temp 98.4°F | Resp 18 | Ht 69.0 in | Wt 350.0 lb

## 2016-01-01 DIAGNOSIS — M5430 Sciatica, unspecified side: Secondary | ICD-10-CM

## 2016-01-01 DIAGNOSIS — Z9889 Other specified postprocedural states: Secondary | ICD-10-CM | POA: Diagnosis not present

## 2016-01-01 DIAGNOSIS — M4316 Spondylolisthesis, lumbar region: Secondary | ICD-10-CM | POA: Diagnosis not present

## 2016-01-01 DIAGNOSIS — M545 Low back pain: Secondary | ICD-10-CM | POA: Diagnosis present

## 2016-01-01 DIAGNOSIS — M533 Sacrococcygeal disorders, not elsewhere classified: Secondary | ICD-10-CM | POA: Diagnosis not present

## 2016-01-01 DIAGNOSIS — M47816 Spondylosis without myelopathy or radiculopathy, lumbar region: Secondary | ICD-10-CM | POA: Diagnosis not present

## 2016-01-01 DIAGNOSIS — M5136 Other intervertebral disc degeneration, lumbar region: Secondary | ICD-10-CM | POA: Insufficient documentation

## 2016-01-01 DIAGNOSIS — M79606 Pain in leg, unspecified: Secondary | ICD-10-CM | POA: Diagnosis present

## 2016-01-01 DIAGNOSIS — M4806 Spinal stenosis, lumbar region: Secondary | ICD-10-CM | POA: Diagnosis not present

## 2016-01-01 DIAGNOSIS — M5126 Other intervertebral disc displacement, lumbar region: Secondary | ICD-10-CM | POA: Diagnosis not present

## 2016-01-01 DIAGNOSIS — M48062 Spinal stenosis, lumbar region with neurogenic claudication: Secondary | ICD-10-CM

## 2016-01-01 DIAGNOSIS — M51369 Other intervertebral disc degeneration, lumbar region without mention of lumbar back pain or lower extremity pain: Secondary | ICD-10-CM

## 2016-01-01 NOTE — Progress Notes (Signed)
Subjective:    Patient ID: Gary Trevino., male    DOB: 11-30-1955, 60 y.o.   MRN: [id number]  HPI  The patient is a 60 year old gentleman who returns to pain management for further evaluation and treatment of pain involving the lumbar and lower extremity region. The patient states that he had significant improvement of his pain following previous lumbar epidural steroid injection. The patient states that he does note some return of pain is with desire to proceed with a second lumbar epidural steroid injection at time of return appointment. The patient admitted trauma change in events of daily living the call significant change in symptomatology. We will continue presently prescribed medications Neurontin and Effexor and tramadol. We remain available to consider modification of treatment pending follow-up evaluation. We will proceed with lumbar epidural steroid injection to be performed at time return appointment. All agreed to suggested treatment plan  Review of Systems     Objective:   Physical Exam  There was mild tenderness of the splenius capitis and occipitalis region with palpation over the cervical and thoracic facet reproducing mild discomfort. There was mild tenderness of the acromioclavicular and glenohumeral joint region and patient appeared to be with bilaterally equal grip strength. Palpation of the thoracic region was with no crepitus of the thoracic region noted. Palpation over the lumbar region was with tenderness to palpation of moderate degree with lateral bending rotation extension and palpation of the lumbar facets reproducing moderate discomfort. There was tenderness over the PSIS and PII S region of mild to moderate degree with mild tenderness of the greater trochanteric region iliotibial band region. Straight leg raising was tolerates approximately 20 without a definite increased pain with dorsiflexion noted. There was negative clonus negative Homans. Abdomen was  nontender with no costovertebral angle tenderness noted.    Assessment & Plan:      Degenerative disc disease of the lumbar spine  T12-L1: Negative.  L1-2: Mild facet joint degenerative changes.  L2-3: Minimal bulge slightly greater right lateral position. This touches but does not compress the right L2 nerve root. Mild bilateral facet joint degenerative changes.  L3-4: Bulge slightly greater left lateral position touching but not causing significant compression of the exiting left L3 nerve root. Mild to moderate facet joint degenerative changes. Ligamentum flavum hypertrophy greater on the left. Prominent epidural fat. Moderate thecal sac narrowing.  L4-5: Marked bilateral facet joint degenerative changes with bony overgrowth. Fluid within the degenerated left facet joint undermining facet joint hypertrophy with an appearance suggesting early synovial cyst formation. 6.8 mm anterior slip of L4. Moderate bulge. Bulging disc material greater left lateral position with slight encroachment upon the exiting left L4 nerve root. Multifactorial marked bilateral lateral recess stenosis slightly greater on the left. Multifactorial moderate to marked thecal sac narrowing.  L5-S1: Moderate bilateral facet joint degenerative changes greater on the right. Bulge. Mild bilateral foraminal narrowing.  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  Lumbar facet syndrome  Lumbar stenosis with neurogenic claudication  Spondylolisthesis L4-L5  Degenerative disc disease cervical spine  Cervical facet syndrome  Status post surgery of cervical region times two  Sacroiliac joint dysfunction      PLAN  Continue present medications Neurontin  Effexor and tramadol  Lumbar epidural steroid injection to be performed at time of return appointment   F/U PCP Dr. Lisette Grinder  III for evaliation of  BP and general medical  condition  F/U  surgical evaluation. May consider pending follow-up evaluations  F/U neurological evaluation. May consider pending follow-up evaluations  May consider radiofrequency rhizolysis or intraspinal procedures pending response to present treatment and F/U evaluation   Patient to call Pain Management Center should patient have concerns prior to scheduled return appointment.

## 2016-01-01 NOTE — Patient Instructions (Addendum)
PLAN  Continue present medications Neurontin  Effexor and tramadol  Lumbar epidural steroid injection to be performed at time of return appointment   F/U PCP Dr. Lisette Grinder III for evaliation of  BP and general medical  condition  F/U surgical evaluation. May consider pending follow-up evaluations  F/U neurological evaluation. May consider pending follow-up evaluations  May consider radiofrequency rhizolysis or intraspinal procedures pending response to present treatment and F/U evaluation   Patient to call [facility] Center should patient have concerns prior to scheduled return appointment. Epidural Steroid Injection Patient Information  Description: The epidural space surrounds the nerves as they exit the spinal cord.  In some patients, the nerves can be compressed and inflamed by a bulging disc or a tight spinal canal (spinal stenosis).  By injecting steroids into the epidural space, we can bring irritated nerves into direct contact with a potentially helpful medication.  These steroids act directly on the irritated nerves and can reduce swelling and inflammation which often leads to decreased pain.  Epidural steroids may be injected anywhere along the spine and from the neck to the low back depending upon the location of your pain.   After numbing the skin with local anesthetic (like Novocaine), a small needle is passed into the epidural space slowly.  You may experience a sensation of pressure while this is being done.  The entire block usually last less than 10 minutes.  Conditions which may be treated by epidural steroids:   Low back and leg pain  Neck and arm pain  Spinal stenosis  Post-laminectomy syndrome  Herpes zoster (shingles) pain  Pain from compression fractures  Preparation for the injection:  1. Do not eat any solid food or dairy products within 8 hours of your appointment.  2. You may drink clear liquids up to 3 hours before appointment.  Clear liquids  include water, black coffee, juice or soda.  No milk or cream please. 3. You may take your regular medication, including pain medications, with a sip of water before your appointment  Diabetics should hold regular insulin (if taken separately) and take 1/2 normal NPH dos the morning of the procedure.  Carry some sugar containing items with you to your appointment. 4. A driver must accompany you and be prepared to drive you home after your procedure.  5. Bring all your current medications with your. 6. An IV may be inserted and sedation may be given at the discretion of the physician.   7. A blood pressure cuff, EKG and other monitors will often be applied during the procedure.  Some patients may need to have extra oxygen administered for a short period. 8. You will be asked to provide medical information, including your allergies, prior to the procedure.  We must know immediately if you are taking blood thinners (like Coumadin/Warfarin)  Or if you are allergic to IV iodine contrast (dye). We must know if you could possible be pregnant.  Possible side-effects:  Bleeding from needle site  Infection (rare, may require surgery)  Nerve injury (rare)  Numbness & tingling (temporary)  Difficulty urinating (rare, temporary)  Spinal headache ( a headache worse with upright posture)  Light -headedness (temporary)  Pain at injection site (several days)  Decreased blood pressure (temporary)  Weakness in arm/leg (temporary)  Pressure sensation in back/neck (temporary)  Call if you experience:  Fever/chills associated with headache or increased back/neck pain.  Headache worsened by an upright position.  New onset weakness or numbness of an extremity  below the injection site  Hives or difficulty breathing (go to the emergency room)  Inflammation or drainage at the infection site  Severe back/neck pain  Any new symptoms which are concerning to you  Please note:  Although the local  anesthetic injected can often make your back or neck feel good for several hours after the injection, the pain will likely return.  It takes 3-7 days for steroids to work in the epidural space.  You may not notice any pain relief for at least that one week.  If effective, we will often do a series of three injections spaced 3-6 weeks apart to maximally decrease your pain.  After the initial series, we generally will wait several months before considering a repeat injection of the same type.  If you have any questions, please call [phone] [place]  What are the risk, side effects and possible complications? Generally speaking, most procedures are safe.  However, with any procedure there are risks, side effects, and the possibility of complications.  The risks and complications are dependent upon the sites that are lesioned, or the type of nerve block to be performed.  The closer the procedure is to the spine, the more serious the risks are.  Great care is taken when placing the radio frequency needles, block needles or lesioning probes, but sometimes complications can occur. 1. Infection: Any time there is an injection through the skin, there is a risk of infection.  This is why sterile conditions are used for these blocks.  There are four possible types of infection. 1. Localized skin infection. 2. Central Nervous System Infection-This can be in the form of Meningitis, which can be deadly. 3. Epidural Infections-This can be in the form of an epidural abscess, which can cause pressure inside of the spine, causing compression of the spinal cord with subsequent paralysis. This would require an emergency surgery to decompress, and there are no guarantees that the patient would recover from the paralysis. 4. Discitis-This is an infection of the intervertebral discs.  It occurs in about 1% of discography procedures.  It is difficult to  treat and it may lead to surgery.        2. Pain: the needles have to go through skin and soft tissues, will cause soreness.       3. Damage to internal structures:  The nerves to be lesioned may be near blood vessels or    other nerves which can be potentially damaged.       4. Bleeding: Bleeding is more common if the patient is taking blood thinners such as  aspirin, Coumadin, Ticiid, Plavix, etc., or if he/she have some genetic predisposition  such as hemophilia. Bleeding into the spinal canal can cause compression of the spinal  cord with subsequent paralysis.  This would require an emergency surgery to  decompress and there are no guarantees that the patient would recover from the  paralysis.       5. Pneumothorax:  Puncturing of a lung is a possibility, every time a needle is introduced in  the area of the chest or upper back.  Pneumothorax refers to free air around the  collapsed lung(s), inside of the thoracic cavity (chest cavity).  Another two possible  complications related to a similar event would include: Hemothorax and Chylothorax.   These are variations of the Pneumothorax, where instead of air around the collapsed  lung(s), you may have blood or chyle,  respectively.       6. Spinal headaches: They may occur with any procedures in the area of the spine.       7. Persistent CSF (Cerebro-Spinal Fluid) leakage: This is a rare problem, but may occur  with prolonged intrathecal or epidural catheters either due to the formation of a fistulous  track or a dural tear.       8. Nerve damage: By working so close to the spinal cord, there is always a possibility of  nerve damage, which could be as serious as a permanent spinal cord injury with  paralysis.       9. Death:  Although rare, severe deadly allergic reactions known as "Anaphylactic  reaction" can occur to any of the medications used.      10. Worsening of the symptoms:  We can always make thing worse.  What are the chances of something  like this happening? Chances of any of this occuring are extremely low.  By statistics, you have more of a chance of getting killed in a motor vehicle accident: while driving to the hospital than any of the above occurring .  Nevertheless, you should be aware that they are possibilities.  In general, it is similar to taking a shower.  Everybody knows that you can slip, hit your head and get killed.  Does that mean that you should not shower again?  Nevertheless always keep in mind that statistics do not mean anything if you happen to be on the wrong side of them.  Even if a procedure has a 1 (one) in a 1,000,000 (million) chance of going wrong, it you happen to be that one..Also, keep in mind that by statistics, you have more of a chance of having something go wrong when taking medications.  Who should not have this procedure? If you are on a blood thinning medication (e.g. Coumadin, Plavix, see list of "Blood Thinners"), or if you have an active infection going on, you should not have the procedure.  If you are taking any blood thinners, please inform your physician.  How should I prepare for this procedure?  Do not eat or drink anything at least six hours prior to the procedure.  Bring a driver with you .  It cannot be a taxi.  Come accompanied by an adult that can drive you back, and that is strong enough to help you if your legs get weak or numb from the local anesthetic.  Take all of your medicines the morning of the procedure with just enough water to swallow them.  If you have diabetes, make sure that you are scheduled to have your procedure done first thing in the morning, whenever possible.  If you have diabetes, take only half of your insulin dose and notify our nurse that you have done so as soon as you arrive at the clinic.  If you are diabetic, but only take blood sugar pills (oral hypoglycemic), then do not take them on the morning of your procedure.  You may take them after you  have had the procedure.  Do not take aspirin or any aspirin-containing medications, at least eleven (11) days prior to the procedure.  They may prolong bleeding.  Wear loose fitting clothing that may be easy to take off and that you would not mind if it got stained with Betadine or blood.  Do not wear any jewelry or perfume  Remove any nail coloring.  It will interfere with some of our monitoring  equipment.  NOTE: Remember that this is not meant to be interpreted as a complete list of all possible complications.  Unforeseen problems may occur.  BLOOD THINNERS The following drugs contain aspirin or other products, which can cause increased bleeding during surgery and should not be taken for 2 weeks prior to and 1 week after surgery.  If you should need take something for relief of minor pain, you may take acetaminophen which is found in Tylenol,m Datril, Anacin-3 and Panadol. It is not blood thinner. The products listed below are.  Do not take any of the products listed below in addition to any listed on your instruction sheet.  A.P.C or A.P.C with Codeine Codeine Phosphate Capsules #3 Ibuprofen Ridaura  ABC compound Congesprin Imuran rimadil  Advil Cope Indocin Robaxisal  Alka-Seltzer Effervescent Pain Reliever and Antacid Coricidin or Coricidin-D  Indomethacin Rufen  Alka-Seltzer plus Cold Medicine Cosprin Ketoprofen S-A-C Tablets  Anacin Analgesic Tablets or Capsules Coumadin Korlgesic Salflex  Anacin Extra Strength Analgesic tablets or capsules CP-2 Tablets Lanoril Salicylate  Anaprox Cuprimine Capsules Levenox Salocol  Anexsia-D Dalteparin Magan Salsalate  Anodynos Darvon compound Magnesium Salicylate Sine-off  Ansaid Dasin Capsules Magsal Sodium Salicylate  Anturane Depen Capsules Marnal Soma  APF Arthritis pain formula Dewitt's Pills Measurin Stanback  Argesic Dia-Gesic Meclofenamic Sulfinpyrazone  Arthritis Bayer Timed Release Aspirin Diclofenac Meclomen Sulindac  Arthritis pain  formula Anacin Dicumarol Medipren Supac  Analgesic (Safety coated) Arthralgen Diffunasal Mefanamic Suprofen  Arthritis Strength Bufferin Dihydrocodeine Mepro Compound Suprol  Arthropan liquid Dopirydamole Methcarbomol with Aspirin Synalgos  ASA tablets/Enseals Disalcid Micrainin Tagament  Ascriptin Doan's Midol Talwin  Ascriptin A/D Dolene Mobidin Tanderil  Ascriptin Extra Strength Dolobid Moblgesic Ticlid  Ascriptin with Codeine Doloprin or Doloprin with Codeine Momentum Tolectin  Asperbuf Duoprin Mono-gesic Trendar  Aspergum Duradyne Motrin or Motrin IB Triminicin  Aspirin plain, buffered or enteric coated Durasal Myochrisine Trigesic  Aspirin Suppositories Easprin Nalfon Trillsate  Aspirin with Codeine Ecotrin Regular or Extra Strength Naprosyn Uracel  Atromid-S Efficin Naproxen Ursinus  Auranofin Capsules Elmiron Neocylate Vanquish  Axotal Emagrin Norgesic Verin  Azathioprine Empirin or Empirin with Codeine Normiflo Vitamin E  Azolid Emprazil Nuprin Voltaren  Bayer Aspirin plain, buffered or children's or timed BC Tablets or powders Encaprin Orgaran Warfarin Sodium  Buff-a-Comp Enoxaparin Orudis Zorpin  Buff-a-Comp with Codeine Equegesic Os-Cal-Gesic   Buffaprin Excedrin plain, buffered or Extra Strength Oxalid   Bufferin Arthritis Strength Feldene Oxphenbutazone   Bufferin plain or Extra Strength Feldene Capsules Oxycodone with Aspirin   Bufferin with Codeine Fenoprofen Fenoprofen Pabalate or Pabalate-SF   Buffets II Flogesic Panagesic   Buffinol plain or Extra Strength Florinal or Florinal with Codeine Panwarfarin   Buf-Tabs Flurbiprofen Penicillamine   Butalbital Compound Four-way cold tablets Penicillin   Butazolidin Fragmin Pepto-Bismol   Carbenicillin Geminisyn Percodan   Carna Arthritis Reliever Geopen Persantine   Carprofen Gold's salt Persistin   Chloramphenicol Goody's Phenylbutazone   Chloromycetin Haltrain Piroxlcam   Clmetidine heparin Plaquenil   Cllnoril  Hyco-pap Ponstel   Clofibrate Hydroxy chloroquine Propoxyphen         Before stopping any of these medications, be sure to consult the physician who ordered them.  Some, such as Coumadin (Warfarin) are ordered to prevent or treat serious conditions such as "deep thrombosis", "pumonary embolisms", and other heart problems.  The amount of time that you may need off of the medication may also vary with the medication and the reason for which you were taking it.  If you are taking  any of these medications, please make sure you notify your pain physician before you undergo any procedures.

## 2016-01-14 ENCOUNTER — Encounter: Payer: Self-pay | Admitting: Pain Medicine

## 2016-01-14 ENCOUNTER — Ambulatory Visit: Payer: Medicare Other | Attending: Pain Medicine | Admitting: Pain Medicine

## 2016-01-14 VITALS — BP 114/80 | Resp 12

## 2016-01-14 DIAGNOSIS — M5136 Other intervertebral disc degeneration, lumbar region: Secondary | ICD-10-CM | POA: Insufficient documentation

## 2016-01-14 DIAGNOSIS — M545 Low back pain: Secondary | ICD-10-CM | POA: Diagnosis present

## 2016-01-14 DIAGNOSIS — M47816 Spondylosis without myelopathy or radiculopathy, lumbar region: Secondary | ICD-10-CM

## 2016-01-14 DIAGNOSIS — M4806 Spinal stenosis, lumbar region: Secondary | ICD-10-CM | POA: Insufficient documentation

## 2016-01-14 DIAGNOSIS — M79606 Pain in leg, unspecified: Secondary | ICD-10-CM | POA: Diagnosis present

## 2016-01-14 DIAGNOSIS — M5126 Other intervertebral disc displacement, lumbar region: Secondary | ICD-10-CM | POA: Diagnosis not present

## 2016-01-14 DIAGNOSIS — M47896 Other spondylosis, lumbar region: Secondary | ICD-10-CM | POA: Insufficient documentation

## 2016-01-14 DIAGNOSIS — M5430 Sciatica, unspecified side: Secondary | ICD-10-CM

## 2016-01-14 DIAGNOSIS — Z9889 Other specified postprocedural states: Secondary | ICD-10-CM

## 2016-01-14 DIAGNOSIS — M533 Sacrococcygeal disorders, not elsewhere classified: Secondary | ICD-10-CM

## 2016-01-14 DIAGNOSIS — M48062 Spinal stenosis, lumbar region with neurogenic claudication: Secondary | ICD-10-CM

## 2016-01-14 MED ORDER — ORPHENADRINE CITRATE 30 MG/ML IJ SOLN
60.0000 mg | Freq: Once | INTRAMUSCULAR | Status: AC
  Administered 2016-01-14: 60 mg via INTRAMUSCULAR
  Filled 2016-01-14: qty 2

## 2016-01-14 MED ORDER — LACTATED RINGERS IV SOLN: 1000.0000 mL | INTRAVENOUS | Status: DC

## 2016-01-14 MED ORDER — TRIAMCINOLONE ACETONIDE 40 MG/ML IJ SUSP
40.0000 mg | Freq: Once | INTRAMUSCULAR | Status: AC
  Administered 2016-01-14: 40 mg
  Filled 2016-01-14: qty 1

## 2016-01-14 MED ORDER — SODIUM CHLORIDE 0.9% FLUSH
20.0000 mL | Freq: Once | INTRAVENOUS | Status: AC
  Administered 2016-01-14: 4 mL

## 2016-01-14 MED ORDER — LIDOCAINE HCL (PF) 1 % IJ SOLN
10.0000 mL | Freq: Once | INTRAMUSCULAR | Status: AC
  Administered 2016-01-14: 10 mL via SUBCUTANEOUS
  Filled 2016-01-14: qty 10

## 2016-01-14 MED ORDER — CEFUROXIME AXETIL 250 MG PO TABS: 250.0000 mg | ORAL_TABLET | Freq: Two times a day (BID) | ORAL | 0 refills | Status: AC

## 2016-01-14 MED ORDER — MIDAZOLAM HCL 5 MG/5ML IJ SOLN: 5.0000 mg | Freq: Once | INTRAMUSCULAR | Status: DC

## 2016-01-14 MED ORDER — FENTANYL CITRATE (PF) 100 MCG/2ML IJ SOLN: 100.0000 ug | Freq: Once | INTRAMUSCULAR | Status: DC

## 2016-01-14 MED ORDER — SODIUM CHLORIDE 0.9 % IJ SOLN
INTRAMUSCULAR | Status: AC
  Filled 2016-01-14: qty 20

## 2016-01-14 MED ORDER — CEFAZOLIN IN D5W 1 GM/50ML IV SOLN
1.0000 g | Freq: Once | INTRAVENOUS | Status: AC
Start: 2016-01-14 — End: 2016-01-14
  Administered 2016-01-14: 1 g via INTRAVENOUS

## 2016-01-14 MED ORDER — CEFAZOLIN SODIUM 1 G IJ SOLR
INTRAMUSCULAR | Status: AC
  Administered 2016-01-14: 1 g
  Filled 2016-01-14: qty 10

## 2016-01-14 MED ORDER — BUPIVACAINE HCL (PF) 0.25 % IJ SOLN
30.0000 mL | Freq: Once | INTRAMUSCULAR | Status: AC
Start: 2016-01-14 — End: 2016-01-14
  Administered 2016-01-14: 20 mL
  Filled 2016-01-14: qty 30

## 2016-01-14 NOTE — Patient Instructions (Addendum)
PLAN  Continue present medications. Please obtain Ceftin antibiotic today and begin taking Ceftin antibiotic today as prescribed  F/U PCP Dr. Lisette Grinder III for evaliation of  BP and general medical  condition  F/U surgical evaluation. May consider pending follow-up evaluations  F/U neurological evaluation. May consider pending follow-up evaluations  May consider radiofrequency rhizolysis or intraspinal procedures pending response to present treatment and F/U evaluation   Patient to call Pain Management Center should patient have concerns prior to scheduled return appointment.Epidural Steroid Injection Patient Information  Description: The epidural space surrounds the nerves as they exit the spinal cord.  In some patients, the nerves can be compressed and inflamed by a bulging disc or a tight spinal canal (spinal stenosis).  By injecting steroids into the epidural space, we can bring irritated nerves into direct contact with a potentially helpful medication.  These steroids act directly on the irritated nerves and can reduce swelling and inflammation which often leads to decreased pain.  Epidural steroids may be injected anywhere along the spine and from the neck to the low back depending upon the location of your pain.   After numbing the skin with local anesthetic (like Novocaine), a small needle is passed into the epidural space slowly.  You may experience a sensation of pressure while this is being done.  The entire block usually last less than 10 minutes.  Conditions which may be treated by epidural steroids:   Low back and leg pain  Neck and arm pain  Spinal stenosis  Post-laminectomy syndrome  Herpes zoster (shingles) pain  Pain from compression fractures  Preparation for the injection:  1. Do not eat any solid food or dairy products within 8 hours of your appointment.  2. You may drink clear liquids up to 3 hours before appointment.  Clear liquids include water, black  coffee, juice or soda.  No milk or cream please. 3. You may take your regular medication, including pain medications, with a sip of water before your appointment  Diabetics should hold regular insulin (if taken separately) and take 1/2 normal NPH dos the morning of the procedure.  Carry some sugar containing items with you to your appointment. 4. A driver must accompany you and be prepared to drive you home after your procedure.  5. Bring all your current medications with your. 6. An IV may be inserted and sedation may be given at the discretion of the physician.   7. A blood pressure cuff, EKG and other monitors will often be applied during the procedure.  Some patients may need to have extra oxygen administered for a short period. 8. You will be asked to provide medical information, including your allergies, prior to the procedure.  We must know immediately if you are taking blood thinners (like Coumadin/Warfarin)  Or if you are allergic to IV iodine contrast (dye). We must know if you could possible be pregnant.  Possible side-effects:  Bleeding from needle site  Infection (rare, may require surgery)  Nerve injury (rare)  Numbness & tingling (temporary)  Difficulty urinating (rare, temporary)  Spinal headache ( a headache worse with upright posture)  Light -headedness (temporary)  Pain at injection site (several days)  Decreased blood pressure (temporary)  Weakness in arm/leg (temporary)  Pressure sensation in back/neck (temporary)  Call if you experience:  Fever/chills associated with headache or increased back/neck pain.  Headache worsened by an upright position.  New onset weakness or numbness of an extremity below the injection site  Hives or  difficulty breathing (go to the emergency room)  Inflammation or drainage at the infection site  Severe back/neck pain  Any new symptoms which are concerning to you  Please note:  Although the local anesthetic injected  can often make your back or neck feel good for several hours after the injection, the pain will likely return.  It takes 3-7 days for steroids to work in the epidural space.  You may not notice any pain relief for at least that one week.  If effective, we will often do a series of three injections spaced 3-6 weeks apart to maximally decrease your pain.  After the initial series, we generally will wait several months before considering a repeat injection of the same type.  If you have any questions, please call [phone] [facility]  General Discharge Instructions :  If you need to reach your doctor call: Monday-Friday 8:00 am - 4:00 pm at [phone] or toll free [phone].  After clinic hours [phone] to have operator reach doctor.  Bring all of your medication bottles to all your appointments in the pain clinic.  To cancel or reschedule your appointment with Pain Management please remember to call 24 hours in advance to avoid a fee.  Refer to the educational materials which you have been given on: General Risks, I had my Procedure. Discharge Instructions, Post Sedation.  Post Procedure Instructions:  The drugs you were given will stay in your system until tomorrow, so for the next 24 hours you should not drive, make any legal decisions or drink any alcoholic beverages.  You may eat anything you prefer, but it is better to start with liquids then soups and crackers, and gradually work up to solid foods.  Please notify your doctor immediately if you have any unusual bleeding, trouble breathing or pain that is not related to your normal pain.  Depending on the type of procedure that was done, some parts of your body may feel week and/or numb.  This usually clears up by tonight or the next day.  Walk with the use of an assistive device or accompanied by an adult for the 24 hours.  You may use ice on the  affected area for the first 24 hours.  Put ice in a Ziploc bag and cover with a towel and place against area 15 minutes on 15 minutes off.  You may switch to heat after 24 hours.

## 2016-01-14 NOTE — Progress Notes (Signed)
PROCEDURE PERFORMED: Lumbar epidural steroid injection   NOTE: The patient is a 60 y.o. male who returns to [facility] for further evaluation and treatment of pain involving the lumbar and lower extremity region. MRI revealed the patient to be with    Degenerative disc disease of the lumbar spine  T12-L1: Negative.  L1-2: Mild facet joint degenerative changes.  L2-3: Minimal bulge slightly greater right lateral position. This touches but does not compress the right L2 nerve root. Mild bilateral facet joint degenerative changes.  L3-4: Bulge slightly greater left lateral position touching but not causing significant compression of the exiting left L3 nerve root. Mild to moderate facet joint degenerative changes. Ligamentum flavum hypertrophy greater on the left. Prominent epidural fat. Moderate thecal sac narrowing.  L4-5: Marked bilateral facet joint degenerative changes with bony overgrowth. Fluid within the degenerated left facet joint undermining facet joint hypertrophy with an appearance suggesting early synovial cyst formation. 6.8 mm anterior slip of L4. Moderate bulge. Bulging disc material greater left lateral position with slight encroachment upon the exiting left L4 nerve root. Multifactorial marked bilateral lateral recess stenosis slightly greater on the left. Multifactorial moderate to marked thecal sac narrowing.  L5-S1: Moderate bilateral facet joint degenerative changes greater on the right. Bulge. Mild bilateral foraminal narrowing.  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 There is concern regarding intraspinal abnormality continue to patient's symptomatology with concern regarding spinal stenosis as well as lumbar radiculopathy in addition to other abnormalities of the spine The risks, benefits, and expectations of the  procedure have been discussed and explained to the patient who was understanding and in agreement with suggested treatment plan. We will proceed with lumbar epidural steroid injection as discussed and as explained to the patient who is willing to proceed with procedure as planned.   DESCRIPTION OF PROCEDURE: Lumbar epidural steroid injection with EKG, blood pressure, pulse, capnography, and pulse oximetry monitoring. The procedure was performed with the patient in the prone position under fluoroscopic guidance. A local anesthetic skin wheal of 1.5% plain lidocaine was accomplished at proposed entry site. An 18-gauge Tuohy epidural needle was inserted at the L 4 vertebral body level right of the midline via loss-of-resistance technique with negative heme and negative CSF return. A total of 4 mL of Preservative-Free normal saline with 40 mg of Kenalog injected incrementally via epidurally placed needle. Needle was removed.    A total of 40 mg of Kenalog was utilized for the procedure.   The patient tolerated the injection well.    PLAN:   1. Medications: We will continue presently prescribed medications. 2. Will consider modification of treatment regimen pending response to treatment rendered on today's visit and follow-up evaluation. 3. The patient is to follow-up with primary care physician Dr. Lisette Grinder III  regarding blood pressure and general medical condition status post lumbar epidural steroid injection performed on today's visit. 4. Surgical evaluation.Has been addressed  5. Neurological evaluation.May consider [facility] EMG studies and other studies  6. The patient may be a candidate for radiofrequency procedures, implantation device, and other treatment pending response to treatment and follow-up evaluation. 7. The patient has been advised to adhere to proper body mechanics and avoid activities which appear to aggravate condition. 8. The patient has been advised to call the Pain Management  Center prior to scheduled return appointment should there be significant change in condition or should there be sign  The  patient is understanding and agrees with the suggested  treatment plan

## 2016-01-14 NOTE — Progress Notes (Signed)
Safety precautions to be maintained throughout the outpatient stay will include: orient to surroundings, keep bed in low position, maintain call bell within reach at all times, provide assistance with transfer out of bed and ambulation.  

## 2016-01-15 ENCOUNTER — Telehealth: Payer: Self-pay | Admitting: *Deleted

## 2016-01-15 NOTE — Telephone Encounter (Signed)
Voicemail left with patient to call our office if there are questions or concerns re; procedure on yesterday.  

## 2016-01-30 ENCOUNTER — Telehealth: Payer: Self-pay

## 2016-01-30 ENCOUNTER — Other Ambulatory Visit: Payer: Self-pay | Admitting: Physical Medicine & Rehabilitation

## 2016-01-30 DIAGNOSIS — M5416 Radiculopathy, lumbar region: Secondary | ICD-10-CM

## 2016-01-30 MED ORDER — VENLAFAXINE HCL 75 MG PO TABS: ORAL_TABLET | ORAL | 0 refills | Status: AC

## 2016-01-30 NOTE — Telephone Encounter (Signed)
Pt is requesting a refill on his venlafaxine 75mg . Please advise on refill? Thanks!

## 2016-02-05 ENCOUNTER — Ambulatory Visit: Payer: Medicare Other | Attending: Pain Medicine | Admitting: Pain Medicine

## 2016-02-05 ENCOUNTER — Encounter: Payer: Self-pay | Admitting: Pain Medicine

## 2016-02-05 VITALS — BP 100/80 | HR 74 | Temp 97.7°F | Resp 18 | Ht 69.0 in | Wt 350.0 lb

## 2016-02-05 DIAGNOSIS — M47816 Spondylosis without myelopathy or radiculopathy, lumbar region: Secondary | ICD-10-CM | POA: Diagnosis not present

## 2016-02-05 DIAGNOSIS — M48062 Spinal stenosis, lumbar region with neurogenic claudication: Secondary | ICD-10-CM

## 2016-02-05 DIAGNOSIS — Z9889 Other specified postprocedural states: Secondary | ICD-10-CM | POA: Diagnosis not present

## 2016-02-05 DIAGNOSIS — M4316 Spondylolisthesis, lumbar region: Secondary | ICD-10-CM | POA: Diagnosis not present

## 2016-02-05 DIAGNOSIS — M5030 Other cervical disc degeneration, unspecified cervical region: Secondary | ICD-10-CM | POA: Diagnosis not present

## 2016-02-05 DIAGNOSIS — M5430 Sciatica, unspecified side: Secondary | ICD-10-CM

## 2016-02-05 DIAGNOSIS — M533 Sacrococcygeal disorders, not elsewhere classified: Secondary | ICD-10-CM | POA: Diagnosis not present

## 2016-02-05 DIAGNOSIS — M5126 Other intervertebral disc displacement, lumbar region: Secondary | ICD-10-CM | POA: Diagnosis not present

## 2016-02-05 DIAGNOSIS — M5136 Other intervertebral disc degeneration, lumbar region: Secondary | ICD-10-CM | POA: Insufficient documentation

## 2016-02-05 DIAGNOSIS — M4806 Spinal stenosis, lumbar region: Secondary | ICD-10-CM | POA: Insufficient documentation

## 2016-02-05 DIAGNOSIS — M545 Low back pain: Secondary | ICD-10-CM | POA: Diagnosis present

## 2016-02-05 MED ORDER — TRAMADOL HCL 50 MG PO TABS: ORAL_TABLET | ORAL | 0 refills | Status: DC

## 2016-02-05 NOTE — Progress Notes (Signed)
Safety precautions to be maintained throughout the outpatient stay will include: orient to surroundings, keep bed in low position, maintain call bell within reach at all times, provide assistance with transfer out of bed and ambulation.  

## 2016-02-05 NOTE — Progress Notes (Signed)
The patient is a 60 year old gentleman who returns to pain management for further evaluation and treatment of pain involving the lumbar lower extremity region predominantly. The patient is status post lumbar epidural steroid injection with significant improvement of the lumbar lower extremity pain. We discussed patient's condition and reviewed medications on today's visit the patient will continue tramadol as prescribed at this time and we will consider patient for lumbar epidural steroid injection to be performed at time return appointment. The patient stated that he is able to stand for longer periods of time without experiencing significant lower back pain and definitely weakness of the lower extremities had decreased following the lumbar epidural steroid injection. Surgical evaluation has been addressed. We will proceed with lumbar epidural steroid injection to be performed at time return appointment. All agreed to suggested treatment plan.     Physical examination   There was tenderness of the splenius capitis and occipitalis region a mild degree with mild tenderness over the cervical facet cervical paraspinal musculature region. Palpation of the acromioclavicular and glenohumeral joint regions reproduces minimal discomfort and patient was able to perform drop test without significant difficulty. Patient appeared to be with bilaterally equal grip strength without increased pain with Tinel and Phalen's maneuver. Palpation over the thoracic region was attends to palpation of moderate degree in the lower thoracic region with no crepitus of the thoracic region noted. Palpation over the lumbar paraspinal musculatures and lumbar facet region associated with moderate discomfort with lateral bending rotation extension and palpation of the lumbar facets reproducing moderate to moderately severe discomfort. Palpation over the PSIS and PII S region was associated with moderate discomfort with mild  tenderness to palpation of the greater trochanteric region iliotibial band region. There was no definite sensory deficit or dermatomal dystrophy detected. EHL strength appeared to be decreased. There was negative clonus negative Homans. Abdomen was nontender with no costovertebral tenderness noted patient in the patient's pain of the lumbar region was aggravated with lateral bending rotation and that reevaluation was with questionably increased pain with dorsiflexion noted.     Assessment     Degenerative disc disease of the lumbar spine  T12-L1: Negative.  L1-2: Mild facet joint degenerative changes.  L2-3: Minimal bulge slightly greater right lateral position. This touches but does not compress the right L2 nerve root. Mild bilateral facet joint degenerative changes.  L3-4: Bulge slightly greater left lateral position touching but not causing significant compression of the exiting left L3 nerve root. Mild to moderate facet joint degenerative changes. Ligamentum flavum hypertrophy greater on the left. Prominent epidural fat. Moderate thecal sac narrowing.  L4-5: Marked bilateral facet joint degenerative changes with bony overgrowth. Fluid within the degenerated left facet joint undermining facet joint hypertrophy with an appearance suggesting early synovial cyst formation. 6.8 mm anterior slip of L4. Moderate bulge. Bulging disc material greater left lateral position with slight encroachment upon the exiting left L4 nerve root. Multifactorial marked bilateral lateral recess stenosis slightly greater on the left. Multifactorial moderate to marked thecal sac narrowing.  L5-S1: Moderate bilateral facet joint degenerative changes greater on the right. Bulge. Mild bilateral foraminal narrowing.  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  Lumbar facet  syndrome  Lumbar stenosis with neurogenic claudication  Spondylolisthesis L4-L5  Degenerative disc disease cervical spine  Cervical facet syndrome  Status post surgery of cervical region times two  Sacroiliac joint dysfunction      PLAN  Continue present medications Neurontin  Effexor and tramadol  Lumbar epidural steroid injection to be performed at time of return appointment  F/U PCP Dr. Lisette Grinder III for evaliation of  BP and general medical  condition  F/U surgical evaluation. May consider pending follow-up evaluations  F/U neurological evaluation. May consider pending follow-up evaluations  May consider radiofrequency rhizolysis or intraspinal procedures pending response to present treatment and F/U evaluation   Patient to call Pain Management Center should patient have concerns prior to scheduled return appointment.

## 2016-02-05 NOTE — Patient Instructions (Addendum)
PLAN  Continue present medications Neurontin  Effexor and tramadol  Lumbar epidural steroid injection to be performed at time of return appointment  F/U PCP Dr. Lisette Grinder III for evaliation of  BP and general medical  condition  F/U surgical evaluation. May consider pending follow-up evaluations  F/U neurological evaluation. May consider pending follow-up evaluations  May consider radiofrequency rhizolysis or intraspinal procedures pending response to present treatment and F/U evaluation   Patient to call Pain Management Center should patient have concerns prior to scheduled return appointment. Epidural Steroid Injection Patient Information  Description: The epidural space surrounds the nerves as they exit the spinal cord.  In some patients, the nerves can be compressed and inflamed by a bulging disc or a tight spinal canal (spinal stenosis).  By injecting steroids into the epidural space, we can bring irritated nerves into direct contact with a potentially helpful medication.  These steroids act directly on the irritated nerves and can reduce swelling and inflammation which often leads to decreased pain.  Epidural steroids may be injected anywhere along the spine and from the neck to the low back depending upon the location of your pain.   After numbing the skin with local anesthetic (like Novocaine), a small needle is passed into the epidural space slowly.  You may experience a sensation of pressure while this is being done.  The entire block usually last less than 10 minutes.  Conditions which may be treated by epidural steroids:   Low back and leg pain  Neck and arm pain  Spinal stenosis  Post-laminectomy syndrome  Herpes zoster (shingles) pain  Pain from compression fractures  Preparation for the injection:  1. Do not eat any solid food or dairy products within 8 hours of your appointment.  2. You may drink clear liquids up to 3 hours before appointment.  Clear liquids  include water, black coffee, juice or soda.  No milk or cream please. 3. You may take your regular medication, including pain medications, with a sip of water before your appointment  Diabetics should hold regular insulin (if taken separately) and take 1/2 normal NPH dos the morning of the procedure.  Carry some sugar containing items with you to your appointment. 4. A driver must accompany you and be prepared to drive you home after your procedure.  5. Bring all your current medications with your. 6. An IV may be inserted and sedation may be given at the discretion of the physician.   7. A blood pressure cuff, EKG and other monitors will often be applied during the procedure.  Some patients may need to have extra oxygen administered for a short period. 8. You will be asked to provide medical information, including your allergies, prior to the procedure.  We must know immediately if you are taking blood thinners (like Coumadin/Warfarin)  Or if you are allergic to IV iodine contrast (dye). We must know if you could possible be pregnant.  Possible side-effects:  Bleeding from needle site  Infection (rare, may require surgery)  Nerve injury (rare)  Numbness & tingling (temporary)  Difficulty urinating (rare, temporary)  Spinal headache ( a headache worse with upright posture)  Light -headedness (temporary)  Pain at injection site (several days)  Decreased blood pressure (temporary)  Weakness in arm/leg (temporary)  Pressure sensation in back/neck (temporary)  Call if you experience:  Fever/chills associated with headache or increased back/neck pain.  Headache worsened by an upright position.  New onset weakness or numbness of an extremity below  the injection site  Hives or difficulty breathing (go to the emergency room)  Inflammation or drainage at the infection site  Severe back/neck pain  Any new symptoms which are concerning to you  Please note:  Although the local  anesthetic injected can often make your back or neck feel good for several hours after the injection, the pain will likely return.  It takes 3-7 days for steroids to work in the epidural space.  You may not notice any pain relief for at least that one week.  If effective, we will often do a series of three injections spaced 3-6 weeks apart to maximally decrease your pain.  After the initial series, we generally will wait several months before considering a repeat injection of the same type.  If you have any questions, please call [phone] [facility]  No prescription given for Tramadol as [facility] called and they stated that patient has 5 refills.

## 2016-02-16 ENCOUNTER — Other Ambulatory Visit: Payer: Self-pay | Admitting: *Deleted

## 2016-02-16 ENCOUNTER — Telehealth: Payer: Self-pay | Admitting: Pain Medicine

## 2016-02-16 MED ORDER — TIZANIDINE HCL 2 MG PO TABS: ORAL_TABLET | ORAL | 2 refills | Status: AC

## 2016-02-16 NOTE — Telephone Encounter (Signed)
Patient needs refill on tizanidine, please call

## 2016-02-18 NOTE — Telephone Encounter (Signed)
This medication is filled by another physician.

## 2016-02-25 ENCOUNTER — Encounter: Payer: Self-pay | Admitting: Pain Medicine

## 2016-02-25 ENCOUNTER — Ambulatory Visit: Payer: Medicare Other | Attending: Pain Medicine | Admitting: Pain Medicine

## 2016-02-25 VITALS — BP 121/81 | HR 70 | Temp 98.3°F | Resp 16 | Ht 69.0 in | Wt 350.0 lb

## 2016-02-25 DIAGNOSIS — M4806 Spinal stenosis, lumbar region: Secondary | ICD-10-CM | POA: Diagnosis not present

## 2016-02-25 DIAGNOSIS — M48062 Spinal stenosis, lumbar region with neurogenic claudication: Secondary | ICD-10-CM

## 2016-02-25 DIAGNOSIS — M47816 Spondylosis without myelopathy or radiculopathy, lumbar region: Secondary | ICD-10-CM | POA: Insufficient documentation

## 2016-02-25 DIAGNOSIS — M533 Sacrococcygeal disorders, not elsewhere classified: Secondary | ICD-10-CM

## 2016-02-25 DIAGNOSIS — M5136 Other intervertebral disc degeneration, lumbar region: Secondary | ICD-10-CM | POA: Diagnosis not present

## 2016-02-25 DIAGNOSIS — M79606 Pain in leg, unspecified: Secondary | ICD-10-CM | POA: Diagnosis present

## 2016-02-25 DIAGNOSIS — M62830 Muscle spasm of back: Secondary | ICD-10-CM | POA: Insufficient documentation

## 2016-02-25 DIAGNOSIS — M5030 Other cervical disc degeneration, unspecified cervical region: Secondary | ICD-10-CM | POA: Insufficient documentation

## 2016-02-25 DIAGNOSIS — M5430 Sciatica, unspecified side: Secondary | ICD-10-CM

## 2016-02-25 DIAGNOSIS — Z9889 Other specified postprocedural states: Secondary | ICD-10-CM

## 2016-02-25 DIAGNOSIS — M545 Low back pain: Secondary | ICD-10-CM | POA: Diagnosis present

## 2016-02-25 DIAGNOSIS — M4316 Spondylolisthesis, lumbar region: Secondary | ICD-10-CM | POA: Insufficient documentation

## 2016-02-25 MED ORDER — TRAMADOL HCL 50 MG PO TABS: ORAL_TABLET | ORAL | 0 refills | Status: AC

## 2016-02-25 NOTE — Progress Notes (Signed)
The patient is a 60 year old gentleman who returns to pain management for further evaluation and treatment of pain involving the lower back and lower extremity region. The patient has some improvement of lumbar epidural steroid injection. On today's visit patient missed a pain radiating to the lower extremities of lesser degree. We reviewed MRI findings and will consider patient for interventional treatment consisting of lumbosacral selective nerve root blocks. We will continue tramadol as prescribed at this time and consider additional modifications of treatment pending response to treatment and follow-up evaluation. All agreed to suggested treatment plan     Physical examination  There was tenderness to palpation of the paraspinal musculature in the cervical region cervical facet region with moderate tenderness over the region of the trapezius levator scapula rhomboid musculature regions. Palpation over the cervical and thoracic facet regions reproduce mild to moderate discomfort. There appeared to be unremarkable Spurling's maneuver and patient appeared to be with ability to perform drop test with minimal difficulty. Palpation over the thoracic region was attends to palpation with moderate muscle spasm of the thoracic region. Palpation over the lumbar paraspinal musculatures and lumbar facet region was with moderate tenderness to palpation with lateral bending rotation extension and palpation of the lumbar facets reproducing moderate discomfort. Straight leg raise was tolerates approximately 20 with questionably increased pain with dorsiflexion noted. There was negative clonus negative Homans. No definite sensory deficit or dermatomal distribution detected. Abdomen was protuberant without excessive tends to palpation and no costovertebral tenderness noted.      Assessment    Degenerative disc disease of the lumbar spine  T12-L1: Negative.  L1-2: Mild facet joint degenerative  changes.  L2-3: Minimal bulge slightly greater right lateral position. This touches but does not compress the right L2 nerve root. Mild bilateral facet joint degenerative changes.  L3-4: Bulge slightly greater left lateral position touching but not causing significant compression of the exiting left L3 nerve root. Mild to moderate facet joint degenerative changes. Ligamentum flavum hypertrophy greater on the left. Prominent epidural fat. Moderate thecal sac narrowing.  L4-5: Marked bilateral facet joint degenerative changes with bony overgrowth. Fluid within the degenerated left facet joint undermining facet joint hypertrophy with an appearance suggesting early synovial cyst formation. 6.8 mm anterior slip of L4. Moderate bulge. Bulging disc material greater left lateral position with slight encroachment upon the exiting left L4 nerve root. Multifactorial marked bilateral lateral recess stenosis slightly greater on the left. Multifactorial moderate to marked thecal sac narrowing.  L5-S1: Moderate bilateral facet joint degenerative changes greater on the right. Bulge. Mild bilateral foraminal narrowing.  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  Lumbar facet syndrome  Lumbar stenosis with neurogenic claudication  Spondylolisthesis L4-L5  Degenerative disc disease cervical spine  Cervical facet syndrome  Status post surgery of cervical region times two  Sacroiliac joint dysfunction      PLAN  Continue present medications Neurontin  Effexor and tramadol  We will consider lumbosacral selective nerve root block pending response to present treatment  F/U PCP Dr. Lisette Grinder III for evaliation of  BP and general medical  condition  F/U surgical evaluation. May consider pending follow-up evaluations  F/U neurological evaluation. May consider pending  follow-up evaluations  May consider radiofrequency rhizolysis or intraspinal procedures pending response to present treatment and F/U evaluation   Patient to call Pain Management Center should patient have concerns prior to scheduled return appointment

## 2016-02-25 NOTE — Patient Instructions (Addendum)
  PLAN  Continue present medications Neurontin  Effexor and tramadol  We will consider lumbosacral selective nerve root block pending response to present treatment  F/U PCP Dr. Lisette Grinder III for evaliation of  BP and general medical  condition  F/U surgical evaluation. May consider pending follow-up evaluations  F/U neurological evaluation. May consider pending follow-up evaluations  May consider radiofrequency rhizolysis or intraspinal procedures pending response to present treatment and F/U evaluation   Patient to call Pain Management Center should patient have concerns prior to scheduled return appointment

## 2016-02-25 NOTE — Progress Notes (Signed)
Safety precautions to be maintained throughout the outpatient stay will include: orient to surroundings, keep bed in low position, maintain call bell within reach at all times, provide assistance with transfer out of bed and ambulation.  

## 2016-04-29 ENCOUNTER — Ambulatory Visit: Payer: Medicare Other | Admitting: Physical Medicine & Rehabilitation

## 2016-05-16 ENCOUNTER — Other Ambulatory Visit: Payer: Self-pay | Admitting: Pain Medicine

## 2016-06-18 ENCOUNTER — Other Ambulatory Visit: Payer: Self-pay | Admitting: Pain Medicine

## 2016-08-17 ENCOUNTER — Other Ambulatory Visit: Payer: Self-pay | Admitting: Pain Medicine

## 2017-11-03 ENCOUNTER — Other Ambulatory Visit: Payer: Self-pay | Admitting: Internal Medicine

## 2017-11-03 DIAGNOSIS — R0602 Shortness of breath: Secondary | ICD-10-CM

## 2017-11-03 DIAGNOSIS — I2510 Atherosclerotic heart disease of native coronary artery without angina pectoris: Secondary | ICD-10-CM

## 2017-11-08 ENCOUNTER — Encounter
Admission: RE | Admit: 2017-11-08 | Discharge: 2017-11-08 | Disposition: A | Discharge status: Home / Self Care | Payer: Medicare Other | Source: Ambulatory Visit | Attending: Internal Medicine | Admitting: Internal Medicine

## 2017-11-08 DIAGNOSIS — R0602 Shortness of breath: Secondary | ICD-10-CM | POA: Diagnosis present

## 2017-11-08 DIAGNOSIS — I2510 Atherosclerotic heart disease of native coronary artery without angina pectoris: Secondary | ICD-10-CM | POA: Insufficient documentation

## 2017-11-08 MED ORDER — REGADENOSON 0.4 MG/5ML IV SOLN
0.4000 mg | Freq: Once | INTRAVENOUS | Status: AC
  Administered 2017-11-08: 0.4 mg via INTRAVENOUS
  Filled 2017-11-08: qty 5

## 2017-11-08 MED ORDER — TECHNETIUM TC 99M TETROFOSMIN IV KIT
30.0000 | PACK | Freq: Once | INTRAVENOUS | Status: AC | PRN
  Administered 2017-11-08: 33.279 via INTRAVENOUS

## 2017-11-09 ENCOUNTER — Encounter
Admission: RE | Admit: 2017-11-09 | Discharge: 2017-11-09 | Disposition: A | Discharge status: Home / Self Care | Payer: Medicare Other | Source: Ambulatory Visit | Attending: Internal Medicine | Admitting: Internal Medicine

## 2017-11-09 DIAGNOSIS — I2510 Atherosclerotic heart disease of native coronary artery without angina pectoris: Secondary | ICD-10-CM | POA: Insufficient documentation

## 2017-11-09 DIAGNOSIS — R0602 Shortness of breath: Secondary | ICD-10-CM | POA: Insufficient documentation

## 2017-11-09 LAB — NM MYOCAR MULTI W/SPECT W/WALL MOTION / EF
CHL CUP MPHR: 158 {beats}/min
CHL CUP RESTING HR STRESS: 66 {beats}/min
CSEPEDS: 1 s
Estimated workload: 1 METS
Exercise duration (min): 1 min
LV dias vol: 119 mL (ref 62–150)
LV sys vol: 49 mL
Peak HR: 80 {beats}/min
Percent HR: 50 %
SDS: 3
SRS: 2
SSS: 5
TID: 1.08

## 2017-11-09 MED ORDER — TECHNETIUM TC 99M TETROFOSMIN IV KIT
33.1800 | PACK | Freq: Once | INTRAVENOUS | Status: AC | PRN
  Administered 2017-11-09: 33.18 via INTRAVENOUS

## 2017-11-10 ENCOUNTER — Ambulatory Visit: Payer: Medicare Other

## 2018-10-23 ENCOUNTER — Ambulatory Visit
Admission: RE | Admit: 2018-10-23 | Payer: Medicare Other | Source: Home / Self Care | Admitting: Unknown Physician Specialty

## 2018-10-23 ENCOUNTER — Encounter: Admission: RE | Payer: Self-pay | Source: Home / Self Care

## 2018-10-23 SURGERY — COLONOSCOPY WITH PROPOFOL
Anesthesia: General

## 2020-05-09 ENCOUNTER — Other Ambulatory Visit: Payer: Self-pay

## 2020-05-09 ENCOUNTER — Other Ambulatory Visit
Admission: RE | Admit: 2020-05-09 | Discharge: 2020-05-09 | Disposition: A | Discharge status: Home / Self Care | Payer: Medicare Other | Source: Ambulatory Visit | Attending: Gastroenterology | Admitting: Gastroenterology

## 2020-05-09 DIAGNOSIS — Z01812 Encounter for preprocedural laboratory examination: Secondary | ICD-10-CM | POA: Diagnosis present

## 2020-05-09 DIAGNOSIS — Z20822 Contact with and (suspected) exposure to covid-19: Secondary | ICD-10-CM | POA: Insufficient documentation

## 2020-05-09 LAB — SARS CORONAVIRUS 2 (TAT 6-24 HRS): SARS Coronavirus 2: NEGATIVE

## 2020-05-12 ENCOUNTER — Encounter: Payer: Self-pay | Admitting: *Deleted

## 2020-05-13 ENCOUNTER — Ambulatory Visit: Payer: Medicare Other | Admitting: Certified Registered Nurse Anesthetist

## 2020-05-13 ENCOUNTER — Ambulatory Visit
Admission: RE | Admit: 2020-05-13 | Discharge: 2020-05-13 | Disposition: A | Discharge status: Home / Self Care | Payer: Medicare Other | Attending: Gastroenterology | Admitting: Gastroenterology

## 2020-05-13 ENCOUNTER — Encounter
Admission: RE | Disposition: A | Discharge status: Home / Self Care | Payer: Self-pay | Source: Home / Self Care | Attending: Gastroenterology

## 2020-05-13 DIAGNOSIS — Z86010 Personal history of colonic polyps: Secondary | ICD-10-CM | POA: Diagnosis not present

## 2020-05-13 DIAGNOSIS — Z09 Encounter for follow-up examination after completed treatment for conditions other than malignant neoplasm: Secondary | ICD-10-CM | POA: Diagnosis present

## 2020-05-13 DIAGNOSIS — K640 First degree hemorrhoids: Secondary | ICD-10-CM | POA: Diagnosis not present

## 2020-05-13 DIAGNOSIS — Z7982 Long term (current) use of aspirin: Secondary | ICD-10-CM | POA: Insufficient documentation

## 2020-05-13 DIAGNOSIS — K5730 Diverticulosis of large intestine without perforation or abscess without bleeding: Secondary | ICD-10-CM | POA: Diagnosis not present

## 2020-05-13 DIAGNOSIS — D128 Benign neoplasm of rectum: Secondary | ICD-10-CM | POA: Diagnosis not present

## 2020-05-13 DIAGNOSIS — Z79899 Other long term (current) drug therapy: Secondary | ICD-10-CM | POA: Insufficient documentation

## 2020-05-13 DIAGNOSIS — Z885 Allergy status to narcotic agent status: Secondary | ICD-10-CM | POA: Insufficient documentation

## 2020-05-13 DIAGNOSIS — D120 Benign neoplasm of cecum: Secondary | ICD-10-CM | POA: Insufficient documentation

## 2020-05-13 DIAGNOSIS — D123 Benign neoplasm of transverse colon: Secondary | ICD-10-CM | POA: Diagnosis not present

## 2020-05-13 HISTORY — DX: Depression, unspecified: F32.A

## 2020-05-13 HISTORY — PX: COLONOSCOPY WITH PROPOFOL: SHX5780

## 2020-05-13 HISTORY — DX: Unspecified asthma, uncomplicated: J45.909

## 2020-05-13 HISTORY — DX: Gout due to renal impairment, left elbow: M10.322

## 2020-05-13 SURGERY — COLONOSCOPY WITH PROPOFOL
Anesthesia: General

## 2020-05-13 MED ORDER — LIDOCAINE HCL (CARDIAC) PF 100 MG/5ML IV SOSY
PREFILLED_SYRINGE | INTRAVENOUS | Status: DC | PRN
  Administered 2020-05-13: 100 mg via INTRAVENOUS

## 2020-05-13 MED ORDER — SODIUM CHLORIDE 0.9 % IV SOLN
INTRAVENOUS | Status: DC
  Administered 2020-05-13: 20 mL/h via INTRAVENOUS

## 2020-05-13 MED ORDER — PROPOFOL 500 MG/50ML IV EMUL
INTRAVENOUS | Status: DC | PRN
  Administered 2020-05-13: 150 ug/kg/min via INTRAVENOUS

## 2020-05-13 MED ORDER — PROPOFOL 500 MG/50ML IV EMUL
INTRAVENOUS | Status: AC
  Filled 2020-05-13: qty 50

## 2020-05-13 MED ORDER — PHENYLEPHRINE HCL (PRESSORS) 10 MG/ML IV SOLN
INTRAVENOUS | Status: DC | PRN
  Administered 2020-05-13 (×3): 100 ug via INTRAVENOUS

## 2020-05-13 MED ORDER — PROPOFOL 10 MG/ML IV BOLUS
INTRAVENOUS | Status: DC | PRN
  Administered 2020-05-13: 50 mg via INTRAVENOUS
  Administered 2020-05-13: 40 mg via INTRAVENOUS

## 2020-05-13 MED ORDER — EPHEDRINE 5 MG/ML INJ
INTRAVENOUS | Status: AC
  Filled 2020-05-13: qty 20

## 2020-05-13 NOTE — Interval H&P Note (Signed)
History and Physical Interval Note:  05/13/2020 11:10 AM  Gary W Marrion Coy Sr.  has presented today for surgery, with the diagnosis of [place].  The various methods of treatment have been discussed with the patient and family. After consideration of risks, benefits and other options for treatment, the patient has consented to  Procedure(s): COLONOSCOPY WITH PROPOFOL (N/A) as a surgical intervention.  The patient's history has been reviewed, patient examined, no change in status, stable for surgery.  I have reviewed the patient's chart and labs.  Questions were answered to the patient's satisfaction.     Lesly Rubenstein  Ok to proceed with colonoscopy

## 2020-05-13 NOTE — Anesthesia Preprocedure Evaluation (Signed)
Anesthesia Evaluation  Patient identified by MRN, date of birth, ID band Patient awake    Reviewed: Allergy & Precautions, NPO status , Patient's Chart, lab work & pertinent test results  History of Anesthesia Complications Negative for: history of anesthetic complications  Airway Mallampati: III  TM Distance: >3 FB Neck ROM: Full    Dental  (+) Edentulous Upper, Edentulous Lower   Pulmonary sleep apnea (does not use his CPAP) , COPD,  COPD inhaler, former smoker,    breath sounds clear to auscultation- rhonchi (-) wheezing      Cardiovascular hypertension, Pt. on medications (-) CAD, (-) Past MI, (-) Cardiac Stents and (-) CABG  Rhythm:Regular Rate:Normal - Systolic murmurs and - Diastolic murmurs    Neuro/Psych neg Seizures PSYCHIATRIC DISORDERS Depression negative neurological ROS     GI/Hepatic negative GI ROS, Neg liver ROS,   Endo/Other  negative endocrine ROSneg diabetes  Renal/GU negative Renal ROS     Musculoskeletal  (+) Arthritis ,   Abdominal (+) + obese,   Peds  Hematology negative hematology ROS (+)   Anesthesia Other Findings Past Medical History: No date: Arthritis No date: Asthma No date: CAD (coronary artery disease), native coronary artery No date: COPD (chronic obstructive pulmonary disease) (HCC) No date: Depression No date: Gout due to renal impairment, left elbow No date: Morbid obesity ([place]) No date: OSA (obstructive sleep apnea) No date: Other and unspecified hyperlipidemia No date: Unspecified essential hypertension   Reproductive/Obstetrics                             Anesthesia Physical Anesthesia Plan  ASA: III  Anesthesia Plan: General   Post-op Pain Management:    Induction: Intravenous  PONV Risk Score and Plan: 1 and Propofol infusion  Airway Management Planned: Natural Airway  Additional Equipment:   Intra-op Plan:   Post-operative  Plan:   Informed Consent: I have reviewed the patients History and Physical, chart, labs and discussed the procedure including the risks, benefits and alternatives for the proposed anesthesia with the patient or authorized representative who has indicated his/her understanding and acceptance.     Dental advisory given  Plan Discussed with: CRNA and Anesthesiologist  Anesthesia Plan Comments:         Anesthesia Quick Evaluation

## 2020-05-13 NOTE — Anesthesia Postprocedure Evaluation (Signed)
Anesthesia Post Note  Patient: Gary W Gossman Sr.  Procedure(s) Performed: COLONOSCOPY WITH PROPOFOL (N/A )  Patient location during evaluation: Endoscopy Anesthesia Type: General Level of consciousness: awake and alert and oriented Pain management: pain level controlled Vital Signs Assessment: post-procedure vital signs reviewed and stable Respiratory status: spontaneous breathing, nonlabored ventilation and respiratory function stable Cardiovascular status: blood pressure returned to baseline and stable Postop Assessment: no signs of nausea or vomiting Anesthetic complications: no   No complications documented.   Last Vitals:  Vitals:   05/13/20 1139 05/13/20 1159  BP: 107/67 110/78  Pulse:    Resp:    Temp: 36.8 C   SpO2:      Last Pain:  Vitals:   05/13/20 1159  TempSrc:   PainSc: 0-No pain                 [name]

## 2020-05-13 NOTE — Transfer of Care (Signed)
Immediate Anesthesia Transfer of Care Note  Patient: Gary W Marcin Sr.  Procedure(s) Performed: COLONOSCOPY WITH PROPOFOL (N/A )  Patient Location: PACU and Endoscopy Unit  Anesthesia Type:General  Level of Consciousness: awake, alert  and oriented  Airway & Oxygen Therapy: Patient Spontanous Breathing and Patient connected to face mask oxygen  Post-op Assessment: Report given to RN and Post -op Vital signs reviewed and stable  Post vital signs: Reviewed and stable  Last Vitals:  Vitals Value Taken Time  BP 107/67 05/13/20 1140  Temp    Pulse 83 05/13/20 1140  Resp 19 05/13/20 1140  SpO2 97 % 05/13/20 1140  Vitals shown include unvalidated device data.  Last Pain:  Vitals:   05/13/20 1024  TempSrc: Temporal  PainSc: 0-No pain         Complications: No complications documented.

## 2020-05-13 NOTE — H&P (Signed)
Outpatient short stay form Pre-procedure 05/13/2020 11:08 AM Raylene Miyamoto MD, MPH  Primary Physician: Dr. Gilford Rile  Reason for visit:  Surveillance  History of present illness:   64 y/o gentleman with morbid obesity and history of adenomatous polyps here for surveillance colonoscopy. No blood thinners, abdominal surgeries, or family history of GI malignancies. No new GI symptoms.    Current Facility-Administered Medications:  .  0.9 %  sodium chloride infusion, , Intravenous, Continuous, Nickayla Mcinnis, Hilton Cork, MD, Last Rate: 20 mL/hr at 05/13/20 1037, 20 mL/hr at 05/13/20 1037  Facility-Administered Medications Prior to Admission  Medication Dose Route Frequency Provider Last Rate Last Admin  . fentaNYL (SUBLIMAZE) injection 100 mcg  100 mcg Intravenous Once Mohammed Kindle, MD      . fentaNYL (SUBLIMAZE) injection 100 mcg  100 mcg Intravenous Once Mohammed Kindle, MD      . lactated ringers infusion 1,000 mL  1,000 mL Intravenous Continuous Mohammed Kindle, MD 125 mL/hr at 12/08/15 1016 1,000 mL at 12/08/15 1016  . lactated ringers infusion 1,000 mL  1,000 mL Intravenous Continuous Mohammed Kindle, MD      . midazolam (VERSED) 5 MG/5ML injection 5 mg  5 mg Intravenous Once Mohammed Kindle, MD      . midazolam (VERSED) 5 MG/5ML injection 5 mg  5 mg Intravenous Once Mohammed Kindle, MD       Medications Prior to Admission  Medication Sig Dispense Refill Last Dose  . albuterol (PROAIR HFA) 108 (90 BASE) MCG/ACT inhaler Inhale 2 puffs into the lungs every 6 (six) hours as needed for wheezing.    05/12/2020 at Unknown time  . allopurinol (ZYLOPRIM) 300 MG tablet Take 300 mg by mouth daily.    05/12/2020 at Unknown time  . cholecalciferol (VITAMIN D) 1000 UNITS tablet Take 1,000 Units by mouth daily. Reported on 11/25/2015   05/12/2020 at Unknown time  . gabapentin (NEURONTIN) 100 MG capsule Take 1 capsule (100 mg total) by mouth 3 (three) times daily. 270 capsule 1 05/12/2020 at Unknown time  .  losartan (COZAAR) 50 MG tablet Take 25 mg by mouth daily.    05/13/2020 at 0500  . mometasone (NASONEX) 50 MCG/ACT nasal spray Place 2 sprays into the nose daily.   05/12/2020 at Unknown time  . rosuvastatin (CRESTOR) 5 MG tablet Take 5 mg by mouth daily.   05/12/2020 at Unknown time  . tiZANidine (ZANAFLEX) 2 MG tablet TAKE 2 TABLETS (4 MG TOTAL) BY MOUTH EVERY 8 (EIGHT) HOURS AS NEEDED FOR MUSCLE SPASMS. 90 tablet 2 05/12/2020 at Unknown time  . traMADol (ULTRAM) 50 MG tablet Limit 1-2 tablets by mouth 2-4 times per day if tolerated 240 tablet 0 05/12/2020 at Unknown time  . venlafaxine (EFFEXOR) 75 MG tablet TAKE 1/2 TABLET (37.5 MG TOTAL) BY MOUTH 2 (TWO) TIMES DAILY. 90 tablet 0 05/13/2020 at 0500  . aspirin 81 MG tablet Take 160 mg by mouth daily. (Patient not taking: Reported on 05/13/2020)   Not Taking at Unknown time  . cefUROXime (CEFTIN) 250 MG tablet Take 1 tablet (250 mg total) by mouth 2 (two) times daily with a meal. (Patient not taking: Reported on 01/01/2016) 14 tablet 0   . cefUROXime (CEFTIN) 250 MG tablet Take 1 tablet (250 mg total) by mouth 2 (two) times daily with a meal. (Patient not taking: Reported on 01/14/2016) 14 tablet 0   . simvastatin (ZOCOR) 20 MG tablet Reported on 11/25/2015        Allergies  Allergen Reactions  .  Codeine Itching     Past Medical History:  Diagnosis Date  . Arthritis   . Asthma   . CAD (coronary artery disease), native coronary artery   . COPD (chronic obstructive pulmonary disease) ([place])   . Depression   . Gout due to renal impairment, left elbow   . Morbid obesity ([place])   . OSA (obstructive sleep apnea)   . Other and unspecified hyperlipidemia   . Unspecified essential hypertension     Review of systems:  Otherwise negative.    Physical Exam  Gen: Alert, oriented. Appears stated age.  HEENT:PERRLA. Lungs: No respiratory distress CV: RRR Abd: soft, benign, no masses. Ext: No edema.     Planned procedures: Proceed with  colonoscopy. The patient understands the nature of the planned procedure, indications, risks, alternatives and potential complications including but not limited to bleeding, infection, perforation, damage to internal organs and possible oversedation/side effects from anesthesia. The patient agrees and gives consent to proceed.  Please refer to procedure notes for findings, recommendations and patient disposition/instructions.     Raylene Miyamoto MD, MPH Gastroenterology 05/13/2020  11:08 AM

## 2020-05-13 NOTE — Op Note (Signed)
[facility] Gastroenterology Patient Name: Gary Trevino Procedure Date: 05/13/2020 11:12 AM MRN: [id number] Account #: [id number] Date of Birth: 04/19/1956 Admit Type: Outpatient Age: 64 Room: [facility] ENDO ROOM 1 Gender: Male Note Status: Finalized Procedure:             Colonoscopy Indications:           High risk colon cancer surveillance: Personal history                         of multiple (3 or more) adenomas Providers:             Andrey Farmer MD, MD Referring MD:          Hewitt Blade. Sarina Ser, MD (Referring MD) Medicines:             Monitored Anesthesia Care Complications:         No immediate complications. Estimated blood loss:                         Minimal. Procedure:             Pre-Anesthesia Assessment:                        - Prior to the procedure, a History and Physical was                         performed, and patient medications and allergies were                         reviewed. The patient is competent. The risks and                         benefits of the procedure and the sedation options and                         risks were discussed with the patient. All questions                         were answered and informed consent was obtained.                         Patient identification and proposed procedure were                         verified by the physician, the nurse, the anesthetist                         and the technician in the endoscopy suite. Mental                         Status Examination: alert and oriented. Airway                         Examination: normal oropharyngeal airway and neck                         mobility. Respiratory Examination: clear to  auscultation. CV Examination: normal. Prophylactic                         Antibiotics: The patient does not require prophylactic                         antibiotics. Prior Anticoagulants: The patient has                         taken no previous  anticoagulant or antiplatelet                         agents. ASA Grade Assessment: III - A patient with                         severe systemic disease. After reviewing the risks and                         benefits, the patient was deemed in satisfactory                         condition to undergo the procedure. The anesthesia                         plan was to use monitored anesthesia care (MAC).                         Immediately prior to administration of medications,                         the patient was re-assessed for adequacy to receive                         sedatives. The heart rate, respiratory rate, oxygen                         saturations, blood pressure, adequacy of pulmonary                         ventilation, and response to care were monitored                         throughout the procedure. The physical status of the                         patient was re-assessed after the procedure.                        After obtaining informed consent, the colonoscope was                         passed under direct vision. Throughout the procedure,                         the patient's blood pressure, pulse, and oxygen                         saturations were monitored continuously. The  Colonoscope was introduced through the anus and                         advanced to the the cecum, identified by appendiceal                         orifice and ileocecal valve. The colonoscopy was                         performed without difficulty. The patient tolerated                         the procedure well. The quality of the bowel                         preparation was good. Findings:      The perianal and digital rectal examinations were normal.      A 2 mm polyp was found in the ileocecal valve. The polyp was sessile.       The polyp was removed with a cold snare. Resection and retrieval were       complete. Estimated blood loss was minimal.      A 5 mm  polyp was found in the proximal transverse colon. The polyp was       sessile. The polyp was removed with a cold snare. Resection and       retrieval were complete. Estimated blood loss was minimal.      A 2 mm polyp was found in the distal transverse colon. The polyp was       sessile. The polyp was removed with a cold snare. Resection and       retrieval were complete. Estimated blood loss was minimal.      A 3 mm polyp was found in the rectum. The polyp was sessile. The polyp       was removed with a cold snare. Resection and retrieval were complete.       Estimated blood loss was minimal.      A few small-mouthed diverticula were found in the sigmoid colon.      Non-bleeding internal hemorrhoids were found during retroflexion. The       hemorrhoids were Grade I (internal hemorrhoids that do not prolapse).      The exam was otherwise without abnormality on direct and retroflexion       views. Impression:            - One 2 mm polyp at the ileocecal valve, removed with                         a cold snare. Resected and retrieved.                        - One 5 mm polyp in the proximal transverse colon,                         removed with a cold snare. Resected and retrieved.                        - One 2 mm polyp in the distal transverse colon,  removed with a cold snare. Resected and retrieved.                        - One 3 mm polyp in the rectum, removed with a cold                         snare. Resected and retrieved.                        - Diverticulosis in the sigmoid colon.                        - Non-bleeding internal hemorrhoids.                        - The examination was otherwise normal on direct and                         retroflexion views. Recommendation:        - Discharge patient to home.                        - Resume previous diet.                        - Continue present medications.                        - Await pathology  results.                        - Repeat colonoscopy for surveillance based on                         pathology results.                        - Return to referring physician as previously                         scheduled. Procedure Code(s):     --- Professional ---                        [phone], Colonoscopy, flexible; with removal of                         tumor(s), polyp(s), or other lesion(s) by snare                         technique Diagnosis Code(s):     --- Professional ---                        Z86.010, Personal history of colonic polyps                        K63.5, Polyp of colon                        K62.1, Rectal polyp                        K64.0,  First degree hemorrhoids                        K57.30, Diverticulosis of large intestine without                         perforation or abscess without bleeding CPT copyright 2019 American Medical Association. All rights reserved. The codes documented in this report are preliminary and upon coder review may  be revised to meet current compliance requirements. Andrey Farmer, MD Andrey Farmer MD, MD 05/13/2020 11:39:08 AM Number of Addenda: 0 Note Initiated On: 05/13/2020 11:12 AM Scope Withdrawal Time: 0 hours 11 minutes 16 seconds  Total Procedure Duration: 0 hours 15 minutes 13 seconds  Estimated Blood Loss:  Estimated blood loss was minimal.      [facility]

## 2020-05-13 NOTE — Anesthesia Procedure Notes (Signed)
Procedure Name: MAC Date/Time: 05/13/2020 11:27 AM Performed by: Lily Peer, Camil Hausmann, CRNA Pre-anesthesia Checklist: Patient identified, Emergency Drugs available, Suction available and Patient being monitored Patient Re-evaluated:Patient Re-evaluated prior to induction Oxygen Delivery Method: Simple face mask

## 2020-05-14 LAB — SURGICAL PATHOLOGY

## 2020-05-19 ENCOUNTER — Encounter: Payer: Self-pay | Admitting: Gastroenterology

## 2021-05-26 ENCOUNTER — Other Ambulatory Visit (HOSPITAL_COMMUNITY): Payer: Self-pay | Admitting: Physician Assistant

## 2021-05-26 ENCOUNTER — Ambulatory Visit
Admission: RE | Admit: 2021-05-26 | Discharge: 2021-05-26 | Disposition: A | Discharge status: Home / Self Care | Payer: Medicare Other | Source: Ambulatory Visit | Attending: Physician Assistant | Admitting: Physician Assistant

## 2021-05-26 ENCOUNTER — Other Ambulatory Visit: Payer: Self-pay | Admitting: Physician Assistant

## 2021-05-26 ENCOUNTER — Other Ambulatory Visit: Payer: Self-pay

## 2021-05-26 DIAGNOSIS — M79604 Pain in right leg: Secondary | ICD-10-CM | POA: Insufficient documentation

## 2021-06-12 ENCOUNTER — Other Ambulatory Visit: Payer: Self-pay | Admitting: Physician Assistant

## 2021-06-12 DIAGNOSIS — M79604 Pain in right leg: Secondary | ICD-10-CM

## 2021-06-12 DIAGNOSIS — M5416 Radiculopathy, lumbar region: Secondary | ICD-10-CM

## 2021-06-25 ENCOUNTER — Other Ambulatory Visit: Payer: Self-pay

## 2021-06-25 ENCOUNTER — Ambulatory Visit: Payer: Medicare Other | Admitting: Dermatology

## 2021-06-25 DIAGNOSIS — L299 Pruritus, unspecified: Secondary | ICD-10-CM

## 2021-06-25 MED ORDER — FLUCONAZOLE 200 MG PO TABS: ORAL_TABLET | ORAL | 0 refills | Status: AC

## 2021-06-25 MED ORDER — KETOCONAZOLE 2 % EX SHAM: MEDICATED_SHAMPOO | CUTANEOUS | 11 refills | Status: AC

## 2021-06-25 NOTE — Progress Notes (Signed)
° °  New Patient Visit  Subjective  Gary Trevino. is a 66 y.o. male who presents for the following: Skin Problem (Patient here today for itching mostly at back, sides and neck for 3-4 years, getting worse. Patient has used different moisturizers, over the counter cortisone but has not resolved. ).  Patient uses Oil of Olay soap, no changes is laundry soap. Patient has been given oral prednisone to take by PCP but no rx creams.   The following portions of the chart were reviewed this encounter and updated as appropriate:   Tobacco   Allergies   Meds   Problems   Med Hx   Surg Hx   Fam Hx      Review of Systems:  No other skin or systemic complaints except as noted in HPI or Assessment and Plan.  Objective  Well appearing patient in no apparent distress; mood and affect are within normal limits.  A focused examination was performed including arms, back, flank, chest, abdomen. Relevant physical exam findings are noted in the Assessment and Plan.  trunk See photos             Assessment & Plan  Itching 2ndary to severe generalized tinea versicolor trunk See photos  Start ketoconazole 2% shampoo as a body wash at least 3 days a week for at least 2 months. Let sit a few minutes before rinsing.   No liver problems. Labs from 10/2020 reviewed.   Start fluconazole 200 mg three times weekly x 1 month.   Tinea versicolor is a chronic recurrent skin rash causing discolored scaly spots most commonly seen on back, chest, and/or shoulders.  It is generally asymptomatic. The rash is due to overgrowth of a common type of yeast present on everyone's skin and it is not contagious.  It tends to flare more in the summer due to increased sweating on trunk.  After rash is treated, the scaliness will resolve, but the discoloration will take longer to return to normal pigmentation. The periodic use of an OTC medicated soap/shampoo with zinc or selenium sulfide can be helpful to prevent yeast  overgrowth and recurrence.  Side effects of fluconazole (diflucan) include nausea, diarrhea, headache, dizziness, taste changes, rare risk of irritation of the liver, allergy, or decreased blood counts (which could show up as infection or tiredness).  ketoconazole (NIZORAL) 2 % shampoo - trunk Use as body wash at least three times per week fluconazole (DIFLUCAN) 200 MG tablet - trunk Take 1 pill three times weekly.  Return in about 2 months (around 08/23/2021).  Graciella Belton, RMA, am acting as scribe for Sarina Ser, MD .  Documentation: I have reviewed the above documentation for accuracy and completeness, and I agree with the above.  Sarina Ser, MD

## 2021-06-25 NOTE — Patient Instructions (Addendum)
Start ketoconazole 2% shampoo as a body wash at least 3 days a week for at least 2 months. Let sit a few minutes before rinsing.   Start fluconazole 200 mg three times weekly x 1 month.   Side effects of fluconazole (diflucan) include nausea, diarrhea, headache, dizziness, taste changes, rare risk of irritation of the liver, allergy, or decreased blood counts (which could show up as infection or tiredness).  If You Need Anything After Your Visit  If you have any questions or concerns for your doctor, please call our main line at [phone] and press option 4 to reach your doctor's medical assistant. If no one answers, please leave a voicemail as directed and we will return your call as soon as possible. Messages left after 4 pm will be answered the following business day.   You may also send [place] a message via [place]. We typically respond to MyChart messages within 1-2 business days.  For prescription refills, please ask your pharmacy to contact our office. Our fax number is [phone].  If you have an urgent issue when the clinic is closed that cannot wait until the next business day, you can page your doctor at the number below.    Please note that while we do our best to be available for urgent issues outside of office hours, we are not available 24/7.   If you have an urgent issue and are unable to reach [place], you may choose to seek medical care at your doctor's office, retail clinic, urgent care center, or emergency room.  If you have a medical emergency, please immediately call 911 or go to the emergency department.  Pager Numbers  - Dr. Nehemiah Massed: [phone]  - Dr. Laurence Ferrari: [phone]  - Dr. Nicole Kindred: [phone]  In the event of inclement weather, please call our main line at [phone] for an update on the status of any delays or closures.  Dermatology Medication Tips: Please keep the boxes that topical medications come in in order to help keep track of the instructions  about where and how to use these. Pharmacies typically print the medication instructions only on the boxes and not directly on the medication tubes.   If your medication is too expensive, please contact our office at [phone] option 4 or send [place] a message through [place].   We are unable to tell what your co-pay for medications will be in advance as this is different depending on your insurance coverage. However, we may be able to find a substitute medication at lower cost or fill out paperwork to get insurance to cover a needed medication.   If a prior authorization is required to get your medication covered by your insurance company, please allow [place] 1-2 business days to complete this process.  Drug prices often vary depending on where the prescription is filled and some pharmacies may offer cheaper prices.  The website www.goodrx.com contains coupons for medications through different pharmacies. The prices here do not account for what the cost may be with help from insurance (it may be cheaper with your insurance), but the website can give you the price if you did not use any insurance.  - You can print the associated coupon and take it with your prescription to the pharmacy.  - You may also stop by our office during regular business hours and pick up a GoodRx coupon card.  - If you need your prescription sent electronically to a different pharmacy, notify our office through [facility] or by  phone at [phone] option 4.     Si Usted Necesita Algo Despus de Su Visita  Tambin puede enviarnos un mensaje a travs de Pharmacist, community. Por lo general respondemos a los mensajes de MyChart en el transcurso de 1 a 2 das hbiles.  Para renovar recetas, por favor pida a su farmacia que se ponga en contacto con nuestra oficina. Harland Dingwall de fax es [place] [phone].  Si tiene un asunto urgente cuando la clnica est cerrada y que no puede esperar hasta el siguiente da hbil, puede  llamar/localizar a su doctor(a) al nmero que aparece a continuacin.   Por favor, tenga en cuenta que aunque hacemos todo lo posible para estar disponibles para asuntos urgentes fuera del horario de [place], no estamos disponibles las 24 horas del da, los 7 das de la [place].   Si tiene un problema urgente y no puede comunicarse con nosotros, puede optar por buscar atencin mdica  en el consultorio de su doctor(a), en una clnica privada, en un centro de atencin urgente o en una sala de emergencias.  Si tiene Engineering geologist, por favor llame inmediatamente al 911 o vaya a la sala de emergencias.  Nmeros de bper  - Dr. Nehemiah Massed: [phone]  - Dra. Moye: [phone]  - Dra. Nicole Kindred: [phone]  En caso de inclemencias del [place], por favor llame a Johnsie Kindred principal al [phone] para una actualizacin sobre el [place] de cualquier retraso o cierre.  Consejos para la medicacin en dermatologa: Por favor, guarde las cajas en las que vienen los medicamentos de uso tpico para ayudarle a seguir las instrucciones sobre dnde y cmo usarlos. Las farmacias generalmente imprimen las instrucciones del medicamento slo en las cajas y no directamente en los tubos del [place].   Si su medicamento es muy caro, por favor, pngase en contacto con Zigmund Daniel llamando al [phone] y presione la opcin 4 o envenos un mensaje a travs de Pharmacist, community.   No podemos decirle cul ser su copago por los medicamentos por adelantado ya que esto es diferente dependiendo de la cobertura de su seguro. Sin embargo, es posible que podamos encontrar un medicamento sustituto a Electrical engineer un formulario para que el seguro cubra el medicamento que se considera necesario.   Si se requiere una autorizacin previa para que su compaa de seguros [place] su medicamento, por favor permtanos de 1 a 2 das hbiles para completar este proceso.  Los precios de los medicamentos varan con  frecuencia dependiendo del Environmental consultant de dnde se surte la receta y alguna farmacias pueden ofrecer precios ms baratos.  El sitio web www.goodrx.com tiene cupones para medicamentos de Airline pilot. Los precios aqu no tienen en cuenta lo que podra costar con la ayuda del seguro (puede ser ms barato con su seguro), pero el sitio web puede darle el precio si no utiliz Research scientist (physical sciences).  - Puede imprimir el cupn correspondiente y llevarlo con su receta a la farmacia.  - Tambin puede pasar por nuestra oficina durante el horario de atencin regular y Charity fundraiser una tarjeta de cupones de GoodRx.  - Si necesita que su receta se enve electrnicamente a una farmacia diferente, informe a nuestra oficina a travs de MyChart de [place] o por telfono llamando al [phone] y presione la opcin 4.  Gentle Skin Care Guide  1. Bathe no more than once a day.  2. Avoid bathing in hot water  3. Use a mild soap like Dove, Vanicream, Cetaphil, CeraVe. Can use Lever 2000  or Cetaphil antibacterial soap  4. Use soap only where you need it. On most days, use it under your arms, between your legs, and on your feet. Let the water rinse other areas unless visibly dirty.  5. When you get out of the bath/shower, use a towel to gently blot your skin dry, don't rub it.  6. While your skin is still a little damp, apply a moisturizing cream such as Vanicream, CeraVe, Cetaphil, Eucerin, Sarna lotion or plain Vaseline Jelly. For hands apply Neutrogena [place] Hand Cream or Excipial Hand Cream.  7. Reapply moisturizer any time you start to itch or feel dry.  8. Sometimes using free and clear laundry detergents can be helpful. Fabric softener sheets should be avoided. Downy Free & Gentle liquid, or any liquid fabric softener that is free of dyes and perfumes, it acceptable to use  9. If your doctor has given you prescription creams you may apply moisturizers over them

## 2021-06-30 ENCOUNTER — Ambulatory Visit
Admission: RE | Admit: 2021-06-30 | Discharge: 2021-06-30 | Disposition: A | Discharge status: Home / Self Care | Payer: Medicare Other | Source: Ambulatory Visit | Attending: Physician Assistant | Admitting: Physician Assistant

## 2021-06-30 ENCOUNTER — Encounter: Payer: Self-pay | Admitting: Dermatology

## 2021-06-30 DIAGNOSIS — M5416 Radiculopathy, lumbar region: Secondary | ICD-10-CM | POA: Diagnosis present

## 2021-06-30 DIAGNOSIS — M79604 Pain in right leg: Secondary | ICD-10-CM

## 2021-08-27 ENCOUNTER — Ambulatory Visit: Payer: Medicare Other | Admitting: Dermatology

## 2021-09-01 ENCOUNTER — Ambulatory Visit
Payer: Medicare Other | Attending: Student in an Organized Health Care Education/Training Program | Admitting: Student in an Organized Health Care Education/Training Program

## 2021-09-01 ENCOUNTER — Other Ambulatory Visit: Payer: Self-pay

## 2021-09-01 ENCOUNTER — Encounter: Payer: Self-pay | Admitting: Student in an Organized Health Care Education/Training Program

## 2021-09-01 VITALS — BP 151/110 | HR 81 | Temp 97.7°F | Resp 16 | Ht 71.0 in | Wt 379.4 lb

## 2021-09-01 DIAGNOSIS — M5136 Other intervertebral disc degeneration, lumbar region: Secondary | ICD-10-CM | POA: Diagnosis present

## 2021-09-01 DIAGNOSIS — M48061 Spinal stenosis, lumbar region without neurogenic claudication: Secondary | ICD-10-CM | POA: Diagnosis present

## 2021-09-01 DIAGNOSIS — M48062 Spinal stenosis, lumbar region with neurogenic claudication: Secondary | ICD-10-CM | POA: Diagnosis present

## 2021-09-01 DIAGNOSIS — M47816 Spondylosis without myelopathy or radiculopathy, lumbar region: Secondary | ICD-10-CM | POA: Insufficient documentation

## 2021-09-01 DIAGNOSIS — G8929 Other chronic pain: Secondary | ICD-10-CM | POA: Insufficient documentation

## 2021-09-01 DIAGNOSIS — M5416 Radiculopathy, lumbar region: Secondary | ICD-10-CM | POA: Diagnosis present

## 2021-09-01 NOTE — Patient Instructions (Signed)
GENERAL RISKS AND COMPLICATIONS ? ?What are the risk, side effects and possible complications? ?Generally speaking, most procedures are safe.  However, with any procedure there are risks, side effects, and the possibility of complications.  The risks and complications are dependent upon the sites that are lesioned, or the type of nerve block to be performed.  The closer the procedure is to the spine, the more serious the risks are.  Great care is taken when placing the radio frequency needles, block needles or lesioning probes, but sometimes complications can occur. ?Infection: Any time there is an injection through the skin, there is a risk of infection.  This is why sterile conditions are used for these blocks.  There are four possible types of infection. ?Localized skin infection. ?Central Nervous System Infection-This can be in the form of Meningitis, which can be deadly. ?Epidural Infections-This can be in the form of an epidural abscess, which can cause pressure inside of the spine, causing compression of the spinal cord with subsequent paralysis. This would require an emergency surgery to decompress, and there are no guarantees that the patient would recover from the paralysis. ?Discitis-This is an infection of the intervertebral discs.  It occurs in about 1% of discography procedures.  It is difficult to treat and it may lead to surgery. ? ?      2. Pain: the needles have to go through skin and soft tissues, will cause soreness. ?      3. Damage to internal structures:  The nerves to be lesioned may be near blood vessels or   ? other nerves which can be potentially damaged. ?      4. Bleeding: Bleeding is more common if the patient is taking blood thinners such as  aspirin, Coumadin, Ticiid, Plavix, etc., or if he/she have some genetic predisposition  such as hemophilia. Bleeding into the spinal canal can cause compression of the spinal  cord with subsequent paralysis.  This would require an emergency  surgery to  decompress and there are no guarantees that the patient would recover from the  paralysis. ?      5. Pneumothorax:  Puncturing of a lung is a possibility, every time a needle is introduced in  the area of the chest or upper back.  Pneumothorax refers to free air around the  collapsed lung(s), inside of the thoracic cavity (chest cavity).  Another two possible  complications related to a similar event would include: Hemothorax and Chylothorax.   These are variations of the Pneumothorax, where instead of air around the collapsed  lung(s), you may have blood or chyle, respectively. ?      6. Spinal headaches: They may occur with any procedures in the area of the spine. ?      7. Persistent CSF (Cerebro-Spinal Fluid) leakage: This is a rare problem, but may occur  with prolonged intrathecal or epidural catheters either due to the formation of a fistulous  track or a dural tear. ?      8. Nerve damage: By working so close to the spinal cord, there is always a possibility of  nerve damage, which could be as serious as a permanent spinal cord injury with  paralysis. ?      9. Death:  Although rare, severe deadly allergic reactions known as "Anaphylactic  reaction" can occur to any of the medications used. ?     10. Worsening of the symptoms:  We can always make thing worse. ? ?What are the chances  of something like this happening? ?Chances of any of this occuring are extremely low.  By statistics, you have more of a chance of getting killed in a motor vehicle accident: while driving to the hospital than any of the above occurring .  Nevertheless, you should be aware that they are possibilities.  In general, it is similar to taking a shower.  Everybody knows that you can slip, hit your head and get killed.  Does that mean that you should not shower again?  Nevertheless always keep in mind that statistics do not mean anything if you happen to be on the wrong side of them.  Even if a procedure has a 1 (one) in a  1,000,000 (million) chance of going wrong, it you happen to be that one..Also, keep in mind that by statistics, you have more of a chance of having something go wrong when taking medications. ? ?Who should not have this procedure? ?If you are on a blood thinning medication (e.g. Coumadin, Plavix, see list of "Blood Thinners"), or if you have an active infection going on, you should not have the procedure.  If you are taking any blood thinners, please inform your physician. ? ?How should I prepare for this procedure? ?Do not eat or drink anything at least six hours prior to the procedure. ?Bring a driver with you .  It cannot be a taxi. ?Come accompanied by an adult that can drive you back, and that is strong enough to help you if your legs get weak or numb from the local anesthetic. ?Take all of your medicines the morning of the procedure with just enough water to swallow them. ?If you have diabetes, make sure that you are scheduled to have your procedure done first thing in the morning, whenever possible. ?If you have diabetes, take only half of your insulin dose and notify our nurse that you have done so as soon as you arrive at the clinic. ?If you are diabetic, but only take blood sugar pills (oral hypoglycemic), then do not take them on the morning of your procedure.  You may take them after you have had the procedure. ?Do not take aspirin or any aspirin-containing medications, at least eleven (11) days prior to the procedure.  They may prolong bleeding. ?Wear loose fitting clothing that may be easy to take off and that you would not mind if it got stained with Betadine or blood. ?Do not wear any jewelry or perfume ?Remove any nail coloring.  It will interfere with some of our monitoring equipment. ? ?NOTE: Remember that this is not meant to be interpreted as a complete list of all possible complications.  Unforeseen problems may occur. ? ?BLOOD THINNERS ?The following drugs contain aspirin or other products,  which can cause increased bleeding during surgery and should not be taken for 2 weeks prior to and 1 week after surgery.  If you should need take something for relief of minor pain, you may take acetaminophen which is found in Tylenol,m Datril, Anacin-3 and Panadol. It is not blood thinner. The products listed below are.  Do not take any of the products listed below in addition to any listed on your instruction sheet. ? ?A.P.C or A.P.C with Codeine Codeine Phosphate Capsules #3 Ibuprofen Ridaura  ?ABC compound Congesprin Imuran rimadil  ?Advil Cope Indocin Robaxisal  ?Alka-Seltzer Effervescent Pain Reliever and Antacid Coricidin or Coricidin-D ? Indomethacin Rufen  ?Alka-Seltzer plus Cold Medicine Cosprin Ketoprofen S-A-C Tablets  ?Anacin Analgesic Tablets or Capsules Coumadin  Korlgesic Salflex  ?Anacin Extra Strength Analgesic tablets or capsules CP-2 Tablets Lanoril Salicylate  ?Anaprox Cuprimine Capsules Levenox Salocol  ?Anexsia-D Dalteparin Magan Salsalate  ?Anodynos Darvon compound Magnesium Salicylate Sine-off  ?Ansaid Dasin Capsules Magsal Sodium Salicylate  ?Anturane Depen Capsules Marnal Soma  ?APF Arthritis pain formula Dewitt's Pills Measurin Stanback  ?Argesic Dia-Gesic Meclofenamic Sulfinpyrazone  ?Arthritis Bayer Timed Release Aspirin Diclofenac Meclomen Sulindac  ?Arthritis pain formula Anacin Dicumarol Medipren Supac  ?Analgesic (Safety coated) Arthralgen Diffunasal Mefanamic Suprofen  ?Arthritis Strength Bufferin Dihydrocodeine Mepro Compound Suprol  ?Arthropan liquid Dopirydamole Methcarbomol with Aspirin Synalgos  ?ASA tablets/Enseals Disalcid Micrainin Tagament  ?Ascriptin Doan's Midol Talwin  ?Ascriptin A/D Dolene Mobidin Tanderil  ?Ascriptin Extra Strength Dolobid Moblgesic Ticlid  ?Ascriptin with Codeine Doloprin or Doloprin with Codeine Momentum Tolectin  ?Asperbuf Duoprin Mono-gesic Trendar  ?Aspergum Duradyne Motrin or Motrin IB Triminicin  ?Aspirin plain, buffered or enteric coated  Durasal Myochrisine Trigesic  ?Aspirin Suppositories Easprin Nalfon Trillsate  ?Aspirin with Codeine Ecotrin Regular or Extra Strength Naprosyn Uracel  ?Atromid-S Efficin Naproxen Ursinus  ?Auranofin Capsules Elmiro
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
## 2021-09-01 NOTE — Progress Notes (Signed)
Safety precautions to be maintained throughout the outpatient stay will include: orient to surroundings, keep bed in low position, maintain call bell within reach at all times, provide assistance with transfer out of bed and ambulation.  

## 2021-09-16 ENCOUNTER — Ambulatory Visit (HOSPITAL_BASED_OUTPATIENT_CLINIC_OR_DEPARTMENT_OTHER): Payer: Medicare Other | Admitting: Student in an Organized Health Care Education/Training Program

## 2021-09-16 ENCOUNTER — Ambulatory Visit
Admission: RE | Admit: 2021-09-16 | Discharge: 2021-09-16 | Disposition: A | Discharge status: Home / Self Care | Payer: Medicare Other | Source: Ambulatory Visit | Attending: Student in an Organized Health Care Education/Training Program | Admitting: Student in an Organized Health Care Education/Training Program

## 2021-09-16 ENCOUNTER — Other Ambulatory Visit: Payer: Self-pay

## 2021-09-16 ENCOUNTER — Encounter: Payer: Self-pay | Admitting: Student in an Organized Health Care Education/Training Program

## 2021-09-16 VITALS — BP 134/96 | HR 73 | Temp 98.1°F | Resp 16 | Ht 69.0 in | Wt 375.0 lb

## 2021-09-16 DIAGNOSIS — G8929 Other chronic pain: Secondary | ICD-10-CM

## 2021-09-16 DIAGNOSIS — M48061 Spinal stenosis, lumbar region without neurogenic claudication: Secondary | ICD-10-CM

## 2021-09-16 DIAGNOSIS — M5416 Radiculopathy, lumbar region: Secondary | ICD-10-CM

## 2021-09-16 DIAGNOSIS — M48062 Spinal stenosis, lumbar region with neurogenic claudication: Secondary | ICD-10-CM | POA: Insufficient documentation

## 2021-09-16 MED ORDER — IOHEXOL 180 MG/ML  SOLN
10.0000 mL | Freq: Once | INTRAMUSCULAR | Status: AC
  Administered 2021-09-16: 5 mL via EPIDURAL
  Filled 2021-09-16: qty 20

## 2021-09-16 MED ORDER — ROPIVACAINE HCL 2 MG/ML IJ SOLN
INTRAMUSCULAR | Status: AC
  Filled 2021-09-16: qty 20

## 2021-09-16 MED ORDER — LIDOCAINE HCL 2 % IJ SOLN
20.0000 mL | Freq: Once | INTRAMUSCULAR | Status: AC
  Administered 2021-09-16: 400 mg

## 2021-09-16 MED ORDER — SODIUM CHLORIDE (PF) 0.9 % IJ SOLN
INTRAMUSCULAR | Status: AC
  Filled 2021-09-16: qty 10

## 2021-09-16 MED ORDER — DEXAMETHASONE SODIUM PHOSPHATE 10 MG/ML IJ SOLN
20.0000 mg | Freq: Once | INTRAMUSCULAR | Status: AC
  Administered 2021-09-16: 20 mg

## 2021-09-16 MED ORDER — SODIUM CHLORIDE 0.9% FLUSH
2.0000 mL | Freq: Once | INTRAVENOUS | Status: AC
  Administered 2021-09-16: 2 mL

## 2021-09-16 MED ORDER — LIDOCAINE HCL 2 % IJ SOLN
INTRAMUSCULAR | Status: AC
  Filled 2021-09-16: qty 20

## 2021-09-16 MED ORDER — ROPIVACAINE HCL 2 MG/ML IJ SOLN
2.0000 mL | Freq: Once | INTRAMUSCULAR | Status: AC
  Administered 2021-09-16: 2 mL via EPIDURAL

## 2021-09-16 MED ORDER — DEXAMETHASONE SODIUM PHOSPHATE 10 MG/ML IJ SOLN
INTRAMUSCULAR | Status: AC
  Filled 2021-09-16: qty 2

## 2021-09-16 NOTE — Progress Notes (Signed)
PROVIDER NOTE: Interpretation of information contained herein should be left to medically-trained personnel. Specific patient instructions are provided elsewhere under "Patient Instructions" section of medical record. This document was created in part using STT-dictation technology, any transcriptional errors that may result from this process are unintentional.  ?Patient: Gary Trevino. ?Type: Established ?DOB: 1955/10/20 ?MRN: [id number] ?PCP: Madelyn Brunner, MD  Service: Procedure ?DOS: 09/16/2021 ?Setting: Ambulatory ?Location: Ambulatory outpatient facility ?Delivery: Face-to-face Provider: Gillis Santa, MD ?Specialty: Interventional Pain Management ?Specialty designation: 09 ?Location: Outpatient facility ?Ref. Prov.: Madelyn Brunner, MD   ? ?Primary Reason for Visit: Interventional Pain Management Treatment. ?CC: Back Pain (low) ?  ?Procedure:          ? Type: Trans-Foraminal Epidural Steroid Injection (Lumbar) #1  ?Laterality: Bilateral  ?Level: L5           ?Imaging: Fluoroscopic guidance ?Anesthesia: Local anesthesia (1-2% Lidocaine) ?Anxiolysis: None                 ?Sedation: None. ?DOS: 09/16/2021  ?Performed by: Gillis Santa, MD ? ?Purpose: Diagnostic/Therapeutic ?Indications: Lumbar radicular pain severe enough to impact quality of life or function. ?1. Chronic radicular lumbar pain   ?2. 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 Spinal stenosis, lumbar region, with neurogenic claudication   ?4. Lumbar radiculopathy   ? ?NAS-11 Pain score:  ? Pre-procedure: 7 /10  ? Post-procedure: 5 /10  ? ?  ?Position / Prep / Materials:  ?Position: Prone  ?Prep solution: DuraPrep (Iodine Povacrylex [0.7% available iodine] and Isopropyl Alcohol, 74% w/w) ?Prep Area: Entire Posterior Lumbosacral Area.  From the lower tip of the scapula down to the tailbone and from flank to flank. ?Materials:  ?Tray: Block ?Needle(s):  ?Type: Spinal  ?Gauge (G): 22  ?Length: 7.0-in  ?Qty: 2 ? ?Pre-op H&P Assessment:   ?Mr. Buelow is a 66 y.o. (year old), male patient, seen today for interventional treatment. He  has a past surgical history that includes Spine surgery (6629,4765); Neck surgery; and Colonoscopy with propofol (N/A, 05/13/2020). Mr. Calzadilla has a current medication list which includes the following prescription(s): albuterol, allopurinol, aspirin, cholecalciferol, fluconazole, gabapentin, ketoconazole, losartan, mometasone, rosuvastatin, simvastatin, tizanidine, tramadol, venlafaxine, cefuroxime, and cefuroxime. His primarily concern today is the Back Pain (low) ? ?Initial Vital Signs:  ?Pulse/HCG Rate: 73ECG Heart Rate: 79 ?Temp: 98.1 ?F (36.7 ?C) ?Resp: 20 ?BP:  ?(!) 136/91 ?SpO2: 97 % ? ?BMI: Estimated body mass index is 55.38 kg/m? as calculated from the following: ?  Height as of this encounter: '5\' 9"'$  (1.753 m). ?  Weight as of this encounter: 375 lb (170.1 kg). ? ?Risk Assessment: ?Allergies: Reviewed. He is allergic to codeine.  ?Allergy Precautions: None required ?Coagulopathies: Reviewed. None identified.  ?Blood-thinner therapy: None at this time ?Active Infection(s): Reviewed. None identified. Mr. Sensing is afebrile ? ?Site Confirmation: Mr. Karnes was asked to confirm the procedure and laterality before marking the site ?Procedure checklist: Completed ?Consent: Before the procedure and under the influence of no sedative(s), amnesic(s), or anxiolytics, the patient was informed of the treatment options, risks and possible complications. To fulfill our ethical and legal obligations, as recommended by the American Medical Association's Code of Ethics, I have informed the patient of my clinical impression; the nature and purpose of the treatment or procedure; the risks, benefits, and possible complications of the intervention; the alternatives, including doing nothing; the risk(s) and benefit(s) of the alternative treatment(s) or procedure(s); and the risk(s) and benefit(s) of doing  nothing. ?The patient was  provided information about the general risks and possible complications associated with the procedure. These may include, but are not limited to: failure to achieve desired goals, infection, bleeding, organ or nerve damage, allergic reactions, paralysis, and death. ?In addition, the patient was informed of those risks and complications associated to Spine-related procedures, such as failure to decrease pain; infection (i.e.: Meningitis, epidural or intraspinal abscess); bleeding (i.e.: epidural hematoma, subarachnoid hemorrhage, or any other type of intraspinal or peri-dural bleeding); organ or nerve damage (i.e.: Any type of peripheral nerve, nerve root, or spinal cord injury) with subsequent damage to sensory, motor, and/or autonomic systems, resulting in permanent pain, numbness, and/or weakness of one or several areas of the body; allergic reactions; (i.e.: anaphylactic reaction); and/or death. ?Furthermore, the patient was informed of those risks and complications associated with the medications. These include, but are not limited to: allergic reactions (i.e.: anaphylactic or anaphylactoid reaction(s)); adrenal axis suppression; blood sugar elevation that in diabetics may result in ketoacidosis or comma; water retention that in patients with history of congestive heart failure may result in shortness of breath, pulmonary edema, and decompensation with resultant heart failure; weight gain; swelling or edema; medication-induced neural toxicity; particulate matter embolism and blood vessel occlusion with resultant organ, and/or nervous system infarction; and/or aseptic necrosis of one or more joints. ?Finally, the patient was informed that Medicine is not an exact science; therefore, there is also the possibility of unforeseen or unpredictable risks and/or possible complications that may result in a catastrophic outcome. The patient indicated having understood very clearly. We have given the patient no guarantees  and we have made no promises. Enough time was given to the patient to ask questions, all of which were answered to the patient's satisfaction. Mr. Parke has indicated that he wanted to continue with the procedure. ?Attestation: I, the ordering provider, attest that I have discussed with the patient the benefits, risks, side-effects, alternatives, likelihood of achieving goals, and potential problems during recovery for the procedure that I have provided informed consent. ?Date  Time: 09/16/2021 10:28 AM ? ?Pre-Procedure Preparation:  ?Monitoring: As per clinic protocol. Respiration, ETCO2, SpO2, BP, heart rate and rhythm monitor placed and checked for adequate function ?Safety Precautions: Patient was assessed for positional comfort and pressure points before starting the procedure. ?Time-out: I initiated and conducted the "Time-out" before starting the procedure, as per protocol. The patient was asked to participate by confirming the accuracy of the "Time Out" information. Verification of the correct person, site, and procedure were performed and confirmed by me, the nursing staff, and the patient. "Time-out" conducted as per Joint Commission's Universal Protocol (UP.01.01.01). ?Time: 1106 ? ?Description/Narrative of Procedure:          ?Target: The 6 o'clock position under the pedicle, on the affected side. ?Region: Posterolateral Lumbosacral ?Approach: Posterior Percutaneous Paravertebral approach. ? ?Rationale (medical necessity): procedure needed and proper for the diagnosis and/or treatment of the patient's medical symptoms and needs. ?Procedural Technique Safety Precautions: Aspiration looking for blood return was conducted prior to all injections. At no point did we inject any substances, as a needle was being advanced. No attempts were made at seeking any paresthesias. Safe injection practices and needle disposal techniques used. Medications properly checked for expiration dates. SDV (single dose vial)  medications used. ?Description of the Procedure: Protocol guidelines were followed. The patient was placed in position over the procedure table. The target area was identified and the area prepped in the usual

## 2021-09-16 NOTE — Progress Notes (Signed)
Safety precautions to be maintained throughout the outpatient stay will include: orient to surroundings, keep bed in low position, maintain call bell within reach at all times, provide assistance with transfer out of bed and ambulation.  

## 2021-09-16 NOTE — Patient Instructions (Signed)
Pain Management Discharge Instructions  General Discharge Instructions :  If you need to reach your doctor call: Monday-Friday 8:00 am - 4:00 pm at [phone] or toll free [phone].  After clinic hours [phone] to have operator reach doctor.  Bring all of your medication bottles to all your appointments in the pain clinic.  To cancel or reschedule your appointment with Pain Management please remember to call 24 hours in advance to avoid a fee.  Refer to the educational materials which you have been given on: General Risks, I had my Procedure. Discharge Instructions, Post Sedation.  Post Procedure Instructions:  The drugs you were given will stay in your system until tomorrow, so for the next 24 hours you should not drive, make any legal decisions or drink any alcoholic beverages.  You may eat anything you prefer, but it is better to start with liquids then soups and crackers, and gradually work up to solid foods.  Please notify your doctor immediately if you have any unusual bleeding, trouble breathing or pain that is not related to your normal pain.  Depending on the type of procedure that was done, some parts of your body may feel week and/or numb.  This usually clears up by tonight or the next day.  Walk with the use of an assistive device or accompanied by an adult for the 24 hours.  You may use ice on the affected area for the first 24 hours.  Put ice in a Ziploc bag and cover with a towel and place against area 15 minutes on 15 minutes off.  You may switch to heat after 24 hours.Selective Nerve Root Block Patient Information  Description: Specific nerve roots exit the spinal canal and these nerves can be compressed and inflamed by a bulging disc and bone spurs.  By injecting steroids on the nerve root, we can potentially decrease the inflammation surrounding these nerves, which often leads to decreased pain.  Also, by injecting local anesthesia on the nerve root, this can  provide us helpful information to give to your referring doctor if it decreases your pain.  Selective nerve root blocks can be done along the spine from the neck to the low back depending on the location of your pain.   After numbing the skin with local anesthesia, a small needle is passed to the nerve root and the position of the needle is verified using x-ray pictures.  After the needle is in correct position, we then deposit the medication.  You may experience a pressure sensation while this is being done.  The entire block usually lasts less than 15 minutes.  Conditions that may be treated with selective nerve root blocks: Low back and leg pain Spinal stenosis Diagnostic block prior to potential surgery Neck and arm pain Post laminectomy syndrome  Preparation for the injection:  Do not eat any solid food or dairy products within 8 hours of your appointment. You may drink clear liquids up to 3 hours before an appointment.  Clear liquids include water, black coffee, juice or soda.  No milk or cream please. You may take your regular medications, including pain medications, with a sip of water before your appointment.  Diabetics should hold regular insulin (if taken separately) and take 1/2 normal NPH dose the morning of the procedure.  Carry some sugar containing items with you to your appointment. A driver must accompany you and be prepared to drive you home after your procedure. Bring all your current medications with you. An IV   may be inserted and sedation may be given at the discretion of the physician. A blood pressure cuff, EKG, and other monitors will often be applied during the procedure.  Some patients may need to have extra oxygen administered for a short period. You will be asked to provide medical information, including allergies, prior to the procedure.  We must know immediately if you are taking blood  Thinners (like Coumadin) or if you are allergic to IV iodine contrast  (dye).  Possible side-effects: All are usually temporary Bleeding from needle site Light headedness Numbness and tingling Decreased blood pressure Weakness in arms/legs Pressure sensation in back/neck Pain at injection site (several days)  Possible complications: All are extremely rare Infection Nerve injury Spinal headache (a headache wore with upright position)  Call if you experience: Fever/chills associated with headache or increased back/neck pain Headache worsened by an upright position New onset weakness or numbness of an extremity below the injection site Hives or difficulty breathing (go to the emergency room) Inflammation or drainage at the injection site(s) Severe back/neck pain greater than usual New symptoms which are concerning to you  Please note:  Although the local anesthetic injected can often make your back or neck feel good for several hours after the injection the pain will likely return.  It takes 3-5 days for steroids to work on the nerve root. You may not notice any pain relief for at least one week.  If effective, we will often do a series of 3 injections spaced 3-6 weeks apart to maximally decrease your pain.    If you have any questions, please call [phone] [facility] 

## 2021-09-17 ENCOUNTER — Telehealth: Payer: Self-pay | Admitting: *Deleted

## 2021-09-17 NOTE — Telephone Encounter (Signed)
No problems post procedure. 

## 2021-10-14 ENCOUNTER — Encounter: Payer: Self-pay | Admitting: Student in an Organized Health Care Education/Training Program

## 2021-10-14 ENCOUNTER — Telehealth: Payer: Self-pay | Admitting: *Deleted

## 2021-10-14 NOTE — Telephone Encounter (Signed)
Attempted to call for pre appointment review of allergies/meds. Message left at mobile number, call cannot be completed at home number. ?

## 2021-10-15 ENCOUNTER — Encounter: Payer: Self-pay | Admitting: Student in an Organized Health Care Education/Training Program

## 2021-10-15 ENCOUNTER — Ambulatory Visit
Payer: Medicare Other | Attending: Student in an Organized Health Care Education/Training Program | Admitting: Student in an Organized Health Care Education/Training Program

## 2021-10-15 DIAGNOSIS — M48061 Spinal stenosis, lumbar region without neurogenic claudication: Secondary | ICD-10-CM

## 2021-10-15 DIAGNOSIS — M47816 Spondylosis without myelopathy or radiculopathy, lumbar region: Secondary | ICD-10-CM | POA: Diagnosis not present

## 2021-10-15 DIAGNOSIS — G8929 Other chronic pain: Secondary | ICD-10-CM

## 2021-10-15 DIAGNOSIS — M48062 Spinal stenosis, lumbar region with neurogenic claudication: Secondary | ICD-10-CM

## 2021-10-15 DIAGNOSIS — M5416 Radiculopathy, lumbar region: Secondary | ICD-10-CM

## 2021-10-15 NOTE — Progress Notes (Signed)
Patient: Gary Trevino Category: E/M  Provider: Gillis Santa, MD  ?DOB: 08/19/1955  DOS: 10/15/2021  Location: Office  ?MRN: [id number]  Setting: Ambulatory outpatient  Referring Provider: Madelyn Brunner, MD  ?Type: Established Patient  Specialty: Interventional Pain Management  PCP: Madelyn Brunner, MD  ?Location: Remote location  Delivery: TeleHealth    ? ?Virtual Encounter - Pain Management ?PROVIDER NOTE: Information contained herein reflects review and annotations entered in association with encounter. Interpretation of such information and data should be left to medically-trained personnel. Information provided to patient can be located elsewhere in the medical record under "Patient Instructions". Document created using STT-dictation technology, any transcriptional errors that may result from process are unintentional.  ?  ?Contact & Pharmacy ?Preferred: [phone] ?Home: [phone] (home) ?Mobile: [phone] (mobile) ?E-mail: [email or web address]  ?[id number] [place] [id number]?Phone: [phone]: [phone]?  ?Pre-screening  ?Gary Trevino "in-person" vs "virtual" encounter. He indicated preferring virtual for this encounter.  ? ?Reason ?COVID-19*  Social distancing based on CDC and AMA recommendations.  ? ?I contacted Gary W AAnneSr. on 10/15/2021 via telephone.      I clearly identified myself as BGillis Santa MD. I verified that I was speaking with the correct person using two identifiers (Name: Gary WCHAMP KEETCHSr., and date of birth: 926-Apr-1957. ? ?Consent ?I sought verbal advanced consent from Gary Trevino for virtual visit interactions. I informed Gary Trevino possible security and privacy concerns, risks, and limitations associated with providing "not-in-person" medical evaluation and management services. I also informed Gary Trevino the availability of "in-person" appointments. Finally, I informed  him that there would be a charge for the virtual visit and that he could be  personally, fully or partially, financially responsible for it. Mr. ARapaportexpressed understanding and agreed to proceed.  ? ?Historic Elements   ?Gary Trevino is a 66y.o. year old, male patient evaluated today after our last contact on 09/16/2021. Gary Trevino has a past medical history of Arthritis, Asthma, CAD (coronary artery disease), native coronary artery, COPD (chronic obstructive pulmonary disease) ([place], Depression, Gout due to renal impairment, left elbow, Morbid obesity ([place], OSA (obstructive sleep apnea), Other and unspecified hyperlipidemia, Sleep apnea, and Unspecified essential hypertension. He also  has a past surgical history that includes Spine surgery ((2671,2458; Neck surgery; and Colonoscopy with propofol (N/A, 05/13/2020). Gary Trevino a current medication list which includes the following prescription(s): albuterol, allopurinol, aspirin, cholecalciferol, fluconazole, gabapentin, ketoconazole, losartan, mometasone, rosuvastatin, simvastatin, tizanidine, tramadol, venlafaxine, cefuroxime, and cefuroxime. He  reports that he quit smoking about 14 years ago. His smoking use included cigarettes and cigars. He has a 50.00 pack-year smoking history. He has never used smokeless tobacco. He reports that he does not drink alcohol and does not use drugs. Mr. ARanganathanis allergic to codeine.  ? ?HPI  ?Today, he is being contacted for a post-procedure assessment. ? ? ?Post-procedure evaluation  ? Type: Trans-Foraminal Epidural Steroid Injection (Lumbar) #1  ?Laterality: Bilateral  ?Level: L5           ?Imaging: Fluoroscopic guidance ?Anesthesia: Local anesthesia (1-2% Lidocaine) ?Anxiolysis: None                 ?Sedation: None. ?DOS: 09/16/2021  ?Performed by: BGillis Santa MD ? ?Purpose: Diagnostic/Therapeutic ?Indications: Lumbar radicular pain severe enough to impact quality of life or function. ?1.  Chronic radicular  lumbar pain   ?2. 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 Spinal stenosis, lumbar region, with neurogenic claudication   ?4. Lumbar radiculopathy   ? ?NAS-11 Pain score:  ? Pre-procedure: 7 /10  ? Post-procedure: 5 /10  ? ?   ?Effectiveness:  ?Initial hour after procedure: 100 %  ?Subsequent 4-6 hours post-procedure: 100 %  ?Analgesia past initial 6 hours: 80 % for 2 weeks  ?Ongoing improvement:  ?Analgesic:  30-40% ?Function: Mr. Hustead reports improvement in function ?ROM: Mr. Belvedere reports improvement in ROM ? ? ?Assessment  ?The primary encounter diagnosis was Chronic radicular lumbar pain. 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 ? ?Plan of Care  ?Kahne endorses significant pain relief and functional improvement for the first 2 weeks after his bilateral L5 transforaminal epidural steroid injection for chronic lumbar radicular pain related to neuroforaminal stenosis of his lumbar spine.  He states that after 2 weeks he noticed the pain started to return and he is now currently rating his pain relief at approximately 30 to 40%.  He feels that his pain is a little bit higher up.  He states that his pain became worse after he rode his motorcycle and his riding lawn more.  We discussed repeating transforaminal epidural steroid injection at L4.  Risk and benefits reviewed and patient would like to proceed. ? ? ?Orders:  ?Orders Placed This Encounter  ?Procedures  ? Lumbar Transforaminal Epidural  ?  Standing Status:   Future  ?  Standing Expiration Date:   11/14/2021  ?  Scheduling Instructions:  ?   Side: Bilateral  ?   Level: L4  ?   Sedation: none  ?   Timeframe: ASAP  ?  Order Specific Question:   Where will this procedure be performed?  ?  Answer:   [facility]  ? ?Follow-up plan:   ?Return in about 2 weeks (around 10/29/2021) for B/L  L4 TF ESI, in clinic NS.   ?  ?B/L L5 TF ESI 09/16/21 ?  ?Recent Visits ?Date Type Provider Dept  ?09/16/21 Procedure visit Gillis Santa, MD [facility]  ?09/01/21 Office Visit Gillis Santa, MD [facility]  ?Showing recent visits within past 90 days and meeting all other requirements ?Today's Visits ?Date Type Provider Dept  ?10/15/21 Office Visit Gillis Santa, MD [facility]  ?Showing today's visits and meeting all other requirements ?Future Appointments ?No visits were found meeting these conditions. ?Showing future appointments within next 90 days and meeting all other requirements ? ?I discussed the assessment and treatment plan with the patient. The patient was provided an opportunity to ask questions and all were answered. The patient agreed with the plan and demonstrated an understanding of the instructions. ? ?Patient advised to call back or seek an in-person evaluation if the symptoms or condition worsens. ? ?Duration of encounter: 80mnutes. ? ?Note by: BGillis Santa MD ?Date: 10/15/2021; Time: 1:17 PM ?

## 2021-10-26 ENCOUNTER — Ambulatory Visit (HOSPITAL_BASED_OUTPATIENT_CLINIC_OR_DEPARTMENT_OTHER): Payer: Medicare Other | Admitting: Student in an Organized Health Care Education/Training Program

## 2021-10-26 ENCOUNTER — Encounter: Payer: Self-pay | Admitting: Student in an Organized Health Care Education/Training Program

## 2021-10-26 ENCOUNTER — Ambulatory Visit
Admission: RE | Admit: 2021-10-26 | Discharge: 2021-10-26 | Disposition: A | Discharge status: Home / Self Care | Payer: Medicare Other | Source: Ambulatory Visit | Attending: Student in an Organized Health Care Education/Training Program | Admitting: Student in an Organized Health Care Education/Training Program

## 2021-10-26 VITALS — BP 123/73 | HR 78 | Temp 97.9°F | Resp 16 | Ht 69.0 in | Wt 375.0 lb

## 2021-10-26 DIAGNOSIS — M5416 Radiculopathy, lumbar region: Secondary | ICD-10-CM | POA: Insufficient documentation

## 2021-10-26 DIAGNOSIS — G8929 Other chronic pain: Secondary | ICD-10-CM

## 2021-10-26 DIAGNOSIS — M48061 Spinal stenosis, lumbar region without neurogenic claudication: Secondary | ICD-10-CM | POA: Insufficient documentation

## 2021-10-26 DIAGNOSIS — M48062 Spinal stenosis, lumbar region with neurogenic claudication: Secondary | ICD-10-CM | POA: Diagnosis present

## 2021-10-26 MED ORDER — DEXAMETHASONE SODIUM PHOSPHATE 10 MG/ML IJ SOLN
20.0000 mg | Freq: Once | INTRAMUSCULAR | Status: AC
  Administered 2021-10-26: 20 mg

## 2021-10-26 MED ORDER — IOHEXOL 180 MG/ML  SOLN
10.0000 mL | Freq: Once | INTRAMUSCULAR | Status: AC
  Administered 2021-10-26: 5 mL via EPIDURAL

## 2021-10-26 MED ORDER — DEXAMETHASONE SODIUM PHOSPHATE 10 MG/ML IJ SOLN
INTRAMUSCULAR | Status: AC
  Filled 2021-10-26: qty 2

## 2021-10-26 MED ORDER — SODIUM CHLORIDE 0.9% FLUSH
2.0000 mL | Freq: Once | INTRAVENOUS | Status: AC
  Administered 2021-10-26: 2 mL

## 2021-10-26 MED ORDER — ROPIVACAINE HCL 2 MG/ML IJ SOLN
2.0000 mL | Freq: Once | INTRAMUSCULAR | Status: AC
  Administered 2021-10-26: 2 mL via EPIDURAL

## 2021-10-26 MED ORDER — LIDOCAINE HCL 2 % IJ SOLN
20.0000 mL | Freq: Once | INTRAMUSCULAR | Status: AC
  Administered 2021-10-26: 400 mg

## 2021-10-26 MED ORDER — LIDOCAINE HCL (PF) 2 % IJ SOLN
INTRAMUSCULAR | Status: AC
  Filled 2021-10-26: qty 10

## 2021-10-26 MED ORDER — ROPIVACAINE HCL 2 MG/ML IJ SOLN
INTRAMUSCULAR | Status: AC
  Filled 2021-10-26: qty 20

## 2021-10-26 MED ORDER — SODIUM CHLORIDE (PF) 0.9 % IJ SOLN
INTRAMUSCULAR | Status: AC
  Filled 2021-10-26: qty 10

## 2021-10-26 NOTE — Progress Notes (Signed)
Safety precautions to be maintained throughout the outpatient stay will include: orient to surroundings, keep bed in low position, maintain call bell within reach at all times, provide assistance with transfer out of bed and ambulation.  

## 2021-10-26 NOTE — Patient Instructions (Signed)
Pain Management Discharge Instructions  General Discharge Instructions :  If you need to reach your doctor call: Monday-Friday 8:00 am - 4:00 pm at [phone] or toll free [phone].  After clinic hours [phone] to have operator reach doctor.  Bring all of your medication bottles to all your appointments in the pain clinic.  To cancel or reschedule your appointment with Pain Management please remember to call 24 hours in advance to avoid a fee.  Refer to the educational materials which you have been given on: General Risks, I had my Procedure. Discharge Instructions, Post Sedation.  Post Procedure Instructions:  The drugs you were given will stay in your system until tomorrow, so for the next 24 hours you should not drive, make any legal decisions or drink any alcoholic beverages.  You may eat anything you prefer, but it is better to start with liquids then soups and crackers, and gradually work up to solid foods.  Please notify your doctor immediately if you have any unusual bleeding, trouble breathing or pain that is not related to your normal pain.  Depending on the type of procedure that was done, some parts of your body may feel week and/or numb.  This usually clears up by tonight or the next day.  Walk with the use of an assistive device or accompanied by an adult for the 24 hours.  You may use ice on the affected area for the first 24 hours.  Put ice in a Ziploc bag and cover with a towel and place against area 15 minutes on 15 minutes off.  You may switch to heat after 24 hours.Selective Nerve Root Block Patient Information  Description: Specific nerve roots exit the spinal canal and these nerves can be compressed and inflamed by a bulging disc and bone spurs.  By injecting steroids on the nerve root, we can potentially decrease the inflammation surrounding these nerves, which often leads to decreased pain.  Also, by injecting local anesthesia on the nerve root, this can  provide us helpful information to give to your referring doctor if it decreases your pain.  Selective nerve root blocks can be done along the spine from the neck to the low back depending on the location of your pain.   After numbing the skin with local anesthesia, a small needle is passed to the nerve root and the position of the needle is verified using x-ray pictures.  After the needle is in correct position, we then deposit the medication.  You may experience a pressure sensation while this is being done.  The entire block usually lasts less than 15 minutes.  Conditions that may be treated with selective nerve root blocks: Low back and leg pain Spinal stenosis Diagnostic block prior to potential surgery Neck and arm pain Post laminectomy syndrome  Preparation for the injection:  Do not eat any solid food or dairy products within 8 hours of your appointment. You may drink clear liquids up to 3 hours before an appointment.  Clear liquids include water, black coffee, juice or soda.  No milk or cream please. You may take your regular medications, including pain medications, with a sip of water before your appointment.  Diabetics should hold regular insulin (if taken separately) and take 1/2 normal NPH dose the morning of the procedure.  Carry some sugar containing items with you to your appointment. A driver must accompany you and be prepared to drive you home after your procedure. Bring all your current medications with you. An IV   may be inserted and sedation may be given at the discretion of the physician. A blood pressure cuff, EKG, and other monitors will often be applied during the procedure.  Some patients may need to have extra oxygen administered for a short period. You will be asked to provide medical information, including allergies, prior to the procedure.  We must know immediately if you are taking blood  Thinners (like Coumadin) or if you are allergic to IV iodine contrast  (dye).  Possible side-effects: All are usually temporary Bleeding from needle site Light headedness Numbness and tingling Decreased blood pressure Weakness in arms/legs Pressure sensation in back/neck Pain at injection site (several days)  Possible complications: All are extremely rare Infection Nerve injury Spinal headache (a headache wore with upright position)  Call if you experience: Fever/chills associated with headache or increased back/neck pain Headache worsened by an upright position New onset weakness or numbness of an extremity below the injection site Hives or difficulty breathing (go to the emergency room) Inflammation or drainage at the injection site(s) Severe back/neck pain greater than usual New symptoms which are concerning to you  Please note:  Although the local anesthetic injected can often make your back or neck feel good for several hours after the injection the pain will likely return.  It takes 3-5 days for steroids to work on the nerve root. You may not notice any pain relief for at least one week.  If effective, we will often do a series of 3 injections spaced 3-6 weeks apart to maximally decrease your pain.    If you have any questions, please call [phone] [facility] 

## 2021-10-26 NOTE — Progress Notes (Signed)
PROVIDER NOTE: Interpretation of information contained herein should be left to medically-trained personnel. Specific patient instructions are provided elsewhere under "Patient Instructions" section of medical record. This document was created in part using STT-dictation technology, any transcriptional errors that may result from this process are unintentional.  ?Patient: Gary W Derise Sr. ?Type: Established ?DOB: Nov 08, 1955 ?MRN: [id number] ?PCP: Madelyn Brunner, MD  Service: Procedure ?DOS: 10/26/2021 ?Setting: Ambulatory ?Location: Ambulatory outpatient facility ?Delivery: Face-to-face Provider: Gillis Santa, MD ?Specialty: Interventional Pain Management ?Specialty designation: 09 ?Location: Outpatient facility ?Ref. Prov.: Baxter Hire, MD   ? ?Primary Reason for Visit: Interventional Pain Management Treatment. ?CC:  LBP + B/L leg pain ?  ?Procedure:          ? Type: Trans-Foraminal Epidural Steroid Injection (Lumbar) #2  ?Laterality: Bilateral  ?Level: L4           ?Imaging: Fluoroscopic guidance ?Anesthesia: Local anesthesia (1-2% Lidocaine) ?Anxiolysis: None                 ?Sedation: None. ?DOS: 10/26/2021  ?Performed by: Gillis Santa, MD ? ?Purpose: Diagnostic/Therapeutic ?Indications: Lumbar radicular pain severe enough to impact quality of life or function. ?1. Chronic radicular lumbar pain   ?2. 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 Spinal stenosis, lumbar region, with neurogenic claudication   ?4. Lumbar radiculopathy   ? ?NAS-11 Pain score:  ? Pre-procedure: 8 /10  ? Post-procedure: 8 /10  ? ?  ?Position / Prep / Materials:  ?Position: Prone  ?Prep solution: DuraPrep (Iodine Povacrylex [0.7% available iodine] and Isopropyl Alcohol, 74% w/w) ?Prep Area: Entire Posterior Lumbosacral Area.  From the lower tip of the scapula down to the tailbone and from flank to flank. ?Materials:  ?Tray: Block ?Needle(s):  ?Type: Spinal  ?Gauge (G): 22  ?Length: 7.0-in  ?Qty: 2 ? ?Pre-op H&P Assessment:   ?Gary Trevino is a 66 y.o. (year old), male patient, seen today for interventional treatment. He  has a past surgical history that includes Spine surgery (4782,9562); Neck surgery; and Colonoscopy with propofol (N/A, 05/13/2020). Gary Trevino has a current medication list which includes the following prescription(s): albuterol, allopurinol, gabapentin, ketoconazole, losartan, rosuvastatin, venlafaxine, aspirin, cefuroxime, cefuroxime, cholecalciferol, fluconazole, mometasone, simvastatin, tizanidine, and tramadol. His primarily concern today is the No chief complaint on file. ? ?Initial Vital Signs:  ?Pulse/HCG Rate: 78ECG Heart Rate: 73 ?Temp: 97.9 ?F (36.6 ?C) ?Resp: 16 ?BP:  ?(!) 143/87 ?SpO2: 95 % ? ?BMI: Estimated body mass index is 55.38 kg/m? as calculated from the following: ?  Height as of this encounter: '5\' 9"'$  (1.753 m). ?  Weight as of this encounter: 375 lb (170.1 kg). ? ?Risk Assessment: ?Allergies: Reviewed. He is allergic to codeine.  ?Allergy Precautions: None required ?Coagulopathies: Reviewed. None identified.  ?Blood-thinner therapy: None at this time ?Active Infection(s): Reviewed. None identified. Gary Trevino is afebrile ? ?Site Confirmation: Gary Trevino was asked to confirm the procedure and laterality before marking the site ?Procedure checklist: Completed ?Consent: Before the procedure and under the influence of no sedative(s), amnesic(s), or anxiolytics, the patient was informed of the treatment options, risks and possible complications. To fulfill our ethical and legal obligations, as recommended by the American Medical Association's Code of Ethics, I have informed the patient of my clinical impression; the nature and purpose of the treatment or procedure; the risks, benefits, and possible complications of the intervention; the alternatives, including doing nothing; the risk(s) and benefit(s) of the alternative treatment(s) or procedure(s); and the risk(s)  and benefit(s) of doing nothing. ?The  patient was provided information about the general risks and possible complications associated with the procedure. These may include, but are not limited to: failure to achieve desired goals, infection, bleeding, organ or nerve damage, allergic reactions, paralysis, and death. ?In addition, the patient was informed of those risks and complications associated to Spine-related procedures, such as failure to decrease pain; infection (i.e.: Meningitis, epidural or intraspinal abscess); bleeding (i.e.: epidural hematoma, subarachnoid hemorrhage, or any other type of intraspinal or peri-dural bleeding); organ or nerve damage (i.e.: Any type of peripheral nerve, nerve root, or spinal cord injury) with subsequent damage to sensory, motor, and/or autonomic systems, resulting in permanent pain, numbness, and/or weakness of one or several areas of the body; allergic reactions; (i.e.: anaphylactic reaction); and/or death. ?Furthermore, the patient was informed of those risks and complications associated with the medications. These include, but are not limited to: allergic reactions (i.e.: anaphylactic or anaphylactoid reaction(s)); adrenal axis suppression; blood sugar elevation that in diabetics may result in ketoacidosis or comma; water retention that in patients with history of congestive heart failure may result in shortness of breath, pulmonary edema, and decompensation with resultant heart failure; weight gain; swelling or edema; medication-induced neural toxicity; particulate matter embolism and blood vessel occlusion with resultant organ, and/or nervous system infarction; and/or aseptic necrosis of one or more joints. ?Finally, the patient was informed that Medicine is not an exact science; therefore, there is also the possibility of unforeseen or unpredictable risks and/or possible complications that may result in a catastrophic outcome. The patient indicated having understood very clearly. We have given the patient no  guarantees and we have made no promises. Enough time was given to the patient to ask questions, all of which were answered to the patient's satisfaction. Gary Trevino has indicated that he wanted to continue with the procedure. ?Attestation: I, the ordering provider, attest that I have discussed with the patient the benefits, risks, side-effects, alternatives, likelihood of achieving goals, and potential problems during recovery for the procedure that I have provided informed consent. ?Date  Time: 10/26/2021 10:58 AM ? ?Pre-Procedure Preparation:  ?Monitoring: As per clinic protocol. Respiration, ETCO2, SpO2, BP, heart rate and rhythm monitor placed and checked for adequate function ?Safety Precautions: Patient was assessed for positional comfort and pressure points before starting the procedure. ?Time-out: I initiated and conducted the "Time-out" before starting the procedure, as per protocol. The patient was asked to participate by confirming the accuracy of the "Time Out" information. Verification of the correct person, site, and procedure were performed and confirmed by me, the nursing staff, and the patient. "Time-out" conducted as per Joint Commission's Universal Protocol (UP.01.01.01). ?Time: 1130 ? ?Description/Narrative of Procedure:          ?Target: The 6 o'clock position under the pedicle, on the affected side. ?Region: Posterolateral Lumbosacral ?Approach: Posterior Percutaneous Paravertebral approach. ? ?Rationale (medical necessity): procedure needed and proper for the diagnosis and/or treatment of the patient's medical symptoms and needs. ?Procedural Technique Safety Precautions: Aspiration looking for blood return was conducted prior to all injections. At no point did we inject any substances, as a needle was being advanced. No attempts were made at seeking any paresthesias. Safe injection practices and needle disposal techniques used. Medications properly checked for expiration dates. SDV (single dose  vial) medications used. ?Description of the Procedure: Protocol guidelines were followed. The patient was placed in position over the procedure table. The target area was identified and the area prepped i

## 2021-10-27 ENCOUNTER — Telehealth: Payer: Self-pay | Admitting: *Deleted

## 2021-10-27 NOTE — Telephone Encounter (Signed)
No problems post procedure. 

## 2021-11-23 ENCOUNTER — Encounter: Payer: Self-pay | Admitting: Student in an Organized Health Care Education/Training Program

## 2021-11-24 ENCOUNTER — Encounter: Payer: Self-pay | Admitting: Student in an Organized Health Care Education/Training Program

## 2021-11-24 ENCOUNTER — Ambulatory Visit
Payer: Medicare Other | Attending: Student in an Organized Health Care Education/Training Program | Admitting: Student in an Organized Health Care Education/Training Program

## 2021-11-24 DIAGNOSIS — M48062 Spinal stenosis, lumbar region with neurogenic claudication: Secondary | ICD-10-CM | POA: Diagnosis not present

## 2021-11-24 DIAGNOSIS — M5416 Radiculopathy, lumbar region: Secondary | ICD-10-CM | POA: Diagnosis not present

## 2021-11-24 DIAGNOSIS — G8929 Other chronic pain: Secondary | ICD-10-CM | POA: Diagnosis not present

## 2021-11-24 DIAGNOSIS — M48061 Spinal stenosis, lumbar region without neurogenic claudication: Secondary | ICD-10-CM

## 2021-11-24 NOTE — Progress Notes (Signed)
Patient: Gary Reece Levy Sr.  Service Category: E/M  Provider: Gillis Santa, MD  DOB: 01/15/1956  DOS: 11/24/2021  Location: Office  MRN: [id number]  Setting: Ambulatory outpatient  Referring Provider: Madelyn Brunner, MD  Type: Established Patient  Specialty: Interventional Pain Management  PCP: Madelyn Brunner, MD  Location: Remote location  Delivery: TeleHealth     Virtual Encounter - Pain Management PROVIDER NOTE: Information contained herein reflects review and annotations entered in association with encounter. Interpretation of such information and data should be left to medically-trained personnel. Information provided to patient can be located elsewhere in the medical record under "Patient Instructions". Document created using STT-dictation technology, any transcriptional errors that may result from process are unintentional.    Contact & Pharmacy Preferred: [phone] Home: [phone] (home) Mobile: [phone] (mobile) E-mail: [email or web address]  [id number] [place] [address] [id number]: [phone]: [phone]  Pre-screening  Gary Trevino "in-person" vs "virtual" encounter. He indicated preferring virtual for this encounter.   Reason COVID-19*  Social distancing based on CDC and AMA recommendations.   I contacted Gary WWON KREUZERSr. on 11/24/2021 via telephone.      I clearly identified myself as BGillis Santa MD. I verified that I was speaking with the correct person using two identifiers (Name: Gary WCARRELL RAHMANISr., and date of birth: [id number].  Consent I sought verbal advanced consent from Gary Trevino for virtual visit interactions. I informed Gary Trevino possible security and privacy concerns, risks, and limitations associated with providing "not-in-person" medical evaluation and management services. I also informed Gary Trevino the availability of "in-person" appointments. Finally, I informed  him that there would be a charge for the virtual visit and that he could be  personally, fully or partially, financially responsible for it. Gary Trevino understanding and agreed to proceed.   Historic Elements   Gary Trevino is a 66y.o. year old, male patient evaluated today after our last contact on 10/26/2021. [name] has a past medical history of Arthritis, Asthma, CAD (coronary artery disease), native coronary artery, COPD (chronic obstructive pulmonary disease) ([place], Depression, Gout due to renal impairment, left elbow, Morbid obesity ([place], OSA (obstructive sleep apnea), Other and unspecified hyperlipidemia, Sleep apnea, and Unspecified essential hypertension. He also  has a past surgical history that includes Spine surgery (([id number]; Neck surgery; and Colonoscopy with propofol (N/A, 05/13/2020). Gary Trevino a current medication list which includes the following prescription(s): albuterol, allopurinol, aspirin, ketoconazole, losartan, mometasone, rosuvastatin, simvastatin, tizanidine, tramadol, venlafaxine, cefuroxime, cefuroxime, cholecalciferol, fluconazole, and gabapentin. He  reports that he quit smoking about 14 years ago. His smoking use included cigarettes and cigars. He has a 50.00 pack-year smoking history. He has never used smokeless tobacco. He reports that he does not drink alcohol and does not use drugs. Gary Trevino allergic to codeine.   HPI  Today, he is being contacted for a post-procedure assessment.   Post-procedure evaluation   Type: Trans-Foraminal Epidural Steroid Injection (Lumbar) #2  Laterality: Bilateral  Level: L4           Imaging: Fluoroscopic guidance Anesthesia: Local anesthesia (1-2% Lidocaine) Anxiolysis: None                 Sedation: None. DOS: 10/26/2021  Performed by: BGillis Santa MD  Purpose: Diagnostic/Therapeutic Indications: Lumbar radicular pain severe enough to impact quality of life or function. 1.  Chronic radicular  lumbar pain   2. 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 Spinal stenosis, lumbar region, with neurogenic claudication   4. Lumbar radiculopathy    NAS-11 Pain score:   Pre-procedure: 8 /10   Post-procedure: 8 /10      Effectiveness:  Initial hour after procedure: 50 %  Subsequent 4-6 hours post-procedure: 50 %  Analgesia past initial 6 hours: 80 %  Ongoing improvement:  Analgesic:  60-70% Function: improved ROM: improved    Assessment  The primary encounter diagnosis was Chronic radicular lumbar pain. 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  Plan of Care  Overall patient endorses pain relief and improvement in his functional status after his bilateral L4 transforaminal ESI.  He states that he is performing ADLs more comfortably and in less pain.  We will continue to monitor his symptoms and should he have return of his low back and leg pain to the point that he wants to repeat the injections, patient is instructed to give [place] a call, as needed orders placed below.  Orders:  Orders Placed This Encounter  Procedures   Lumbar Transforaminal Epidural    Standing Status:   Standing    Number of Occurrences:   2    Standing Expiration Date:   05/26/2022    Scheduling Instructions:     Side: Bilateral     Level: TBD     PRN- pt will call    Order Specific Question:   Where will this procedure be performed?    Answer:   [facility]   Follow-up plan:   Return for PRN- pt will call for TFESI .     B/L L5 TF ESI 09/16/21, B/L L4 TF ESI 10/26/21   Recent Visits Date Type Provider Dept  10/26/21 Procedure visit Gillis Santa, MD [facility]  10/15/21 Office Visit Gillis Santa, MD [facility]  09/16/21 Procedure visit Gillis Santa, MD [facility]  09/01/21 Office Visit Gillis Santa, MD  [facility]  Showing recent visits within past 90 days and meeting all other requirements Today's Visits Date Type Provider Dept  11/24/21 Office Visit Gillis Santa, MD [facility]  Showing today's visits and meeting all other requirements Future Appointments No visits were found meeting these conditions. Showing future appointments within next 90 days and meeting all other requirements  I discussed the assessment and treatment plan with the patient. The patient was provided an opportunity to ask questions and all were answered. The patient agreed with the plan and demonstrated an understanding of the instructions.  Patient advised to call back or seek an in-person evaluation if the symptoms or condition worsens.  Duration of encounter: 70mnutes.  Note by: BGillis Santa MD Date: 11/24/2021; Time: 10:46 AM

## 2021-11-24 NOTE — Patient Instructions (Signed)
______________________________________________________________________  Preparing for your procedure (without sedation)  Procedure appointments are limited to planned procedures: No Prescription Refills. No disability issues will be discussed. No medication changes will be discussed.  Instructions: Food Intake: Avoid eating anything for at least 4 hours prior to your procedure. Transportation: Unless otherwise stated by your physician, bring a driver. Morning Medicines: Take all of your scheduled morning medications. If you take heart medicine, except for blood thinners, do not forget to take it the morning of the procedure. If your Diastolic (lower reading) is above 100 mmHg, elective cases will be cancelled/rescheduled. Blood thinners: These will need to be stopped for procedures. Notify our staff if you are taking any blood thinners. Depending on which one you take, there will be specific instructions on how and when to stop it. Diabetics on insulin: Notify the staff so that you can be scheduled 1st case in the morning. If your diabetes requires high dose insulin, take only  of your normal insulin dose the morning of the procedure and notify the staff that you have done so. Preventing infections: Shower with an antibacterial soap the morning of your procedure.  Build-up your immune system: Take 1000 mg of Vitamin C with every meal (3 times a day) the day prior to your procedure. Antibiotics: Inform the staff if you have a condition or reason that requires you to take antibiotics before dental procedures. Pregnancy: If you are pregnant, call and cancel the procedure. Sickness: If you have a cold, fever, or any active infections, call and cancel the procedure. Arrival: You must be in the facility at least 30 minutes prior to your scheduled procedure. Children: Do not bring any children with you. Dress appropriately: There is always a possibility that your clothing may get soiled. Valuables:  Do not bring any jewelry or valuables.  Reasons to call and reschedule or cancel your procedure: (Following these recommendations will minimize the risk of a serious complication.) Surgeries: Avoid having procedures within 2 weeks of any surgery. (Avoid for 2 weeks before or after any surgery). Flu Shots: Avoid having procedures within 2 weeks of a flu shots or . (Avoid for 2 weeks before or after immunizations). Barium: Avoid having a procedure within 7-10 days after having had a radiological study involving the use of radiological contrast. (Myelograms, Barium swallow or enema study). Heart attacks: Avoid any elective procedures or surgeries for the initial 6 months after a "Myocardial Infarction" (Heart Attack). Blood thinners: It is imperative that you stop these medications before procedures. Let us know if you if you take any blood thinner.  Infection: Avoid procedures during or within two weeks of an infection (including chest colds or gastrointestinal problems). Symptoms associated with infections include: Localized redness, fever, chills, night sweats or profuse sweating, burning sensation when voiding, cough, congestion, stuffiness, runny nose, sore throat, diarrhea, nausea, vomiting, cold or Flu symptoms, recent or current infections. It is specially important if the infection is over the area that we intend to treat. Heart and lung problems: Symptoms that may suggest an active cardiopulmonary problem include: cough, chest pain, breathing difficulties or shortness of breath, dizziness, ankle swelling, uncontrolled high or unusually low blood pressure, and/or palpitations. If you are experiencing any of these symptoms, cancel your procedure and contact your primary care physician for an evaluation.  Remember:  Regular Business hours are:  Monday to Thursday 8:00 AM to 4:00 PM  Provider's Schedule: Gary Pointer, MD:  Procedure days: Tuesday and Thursday 7:30 AM to 4:00 PM  Gary  Lateef, MD:  Procedure days: Monday and Wednesday 7:30 AM to 4:00 PM ______________________________________________________________________   

## 2022-12-29 ENCOUNTER — Ambulatory Visit: Admit: 2022-12-29 | Payer: Medicare Other | Admitting: Ophthalmology

## 2022-12-29 SURGERY — PHACOEMULSIFICATION, CATARACT, WITH IOL INSERTION
Anesthesia: Topical | Laterality: Left

## 2023-01-05 ENCOUNTER — Ambulatory Visit: Admit: 2023-01-05 | Payer: Medicare Other | Admitting: Ophthalmology

## 2023-01-05 SURGERY — PHACOEMULSIFICATION, CATARACT, WITH IOL INSERTION
Anesthesia: Topical | Laterality: Right

## 2023-03-18 ENCOUNTER — Other Ambulatory Visit: Payer: Self-pay | Admitting: Dermatology

## 2023-03-18 DIAGNOSIS — L299 Pruritus, unspecified: Secondary | ICD-10-CM

## 2024-02-24 ENCOUNTER — Other Ambulatory Visit: Payer: Self-pay

## 2024-02-24 ENCOUNTER — Ambulatory Visit

## 2024-02-24 ENCOUNTER — Ambulatory Visit
Admission: RE | Admit: 2024-02-24 | Discharge: 2024-02-24 | Disposition: A | Discharge status: Home / Self Care | Attending: Gastroenterology | Admitting: Gastroenterology

## 2024-02-24 ENCOUNTER — Encounter
Admission: RE | Disposition: A | Discharge status: Home / Self Care | Payer: Self-pay | Source: Home / Self Care | Attending: Gastroenterology

## 2024-02-24 DIAGNOSIS — I2510 Atherosclerotic heart disease of native coronary artery without angina pectoris: Secondary | ICD-10-CM | POA: Diagnosis not present

## 2024-02-24 DIAGNOSIS — Z1211 Encounter for screening for malignant neoplasm of colon: Secondary | ICD-10-CM | POA: Insufficient documentation

## 2024-02-24 DIAGNOSIS — K5730 Diverticulosis of large intestine without perforation or abscess without bleeding: Secondary | ICD-10-CM | POA: Diagnosis not present

## 2024-02-24 DIAGNOSIS — Z87891 Personal history of nicotine dependence: Secondary | ICD-10-CM | POA: Insufficient documentation

## 2024-02-24 DIAGNOSIS — J4489 Other specified chronic obstructive pulmonary disease: Secondary | ICD-10-CM | POA: Insufficient documentation

## 2024-02-24 DIAGNOSIS — K640 First degree hemorrhoids: Secondary | ICD-10-CM | POA: Diagnosis not present

## 2024-02-24 DIAGNOSIS — I10 Essential (primary) hypertension: Secondary | ICD-10-CM | POA: Diagnosis not present

## 2024-02-24 DIAGNOSIS — G4733 Obstructive sleep apnea (adult) (pediatric): Secondary | ICD-10-CM | POA: Insufficient documentation

## 2024-02-24 DIAGNOSIS — D122 Benign neoplasm of ascending colon: Secondary | ICD-10-CM | POA: Insufficient documentation

## 2024-02-24 DIAGNOSIS — Z6841 Body Mass Index (BMI) 40.0 and over, adult: Secondary | ICD-10-CM | POA: Insufficient documentation

## 2024-02-24 HISTORY — PX: POLYPECTOMY: SHX149

## 2024-02-24 HISTORY — PX: COLONOSCOPY: SHX5424

## 2024-02-24 SURGERY — COLONOSCOPY
Anesthesia: General

## 2024-02-24 MED ORDER — SODIUM CHLORIDE 0.9 % IV SOLN: INTRAVENOUS | Status: DC

## 2024-02-24 MED ORDER — PROPOFOL 10 MG/ML IV BOLUS: INTRAVENOUS | Status: DC | PRN

## 2024-02-24 MED ORDER — PROPOFOL 500 MG/50ML IV EMUL
INTRAVENOUS | Status: DC | PRN
  Administered 2024-02-24: 100 mg via INTRAVENOUS
  Administered 2024-02-24: 150 ug/kg/min via INTRAVENOUS

## 2024-02-24 NOTE — Anesthesia Preprocedure Evaluation (Signed)
 Anesthesia Evaluation  Patient identified by MRN, date of birth, ID band Patient awake    Reviewed: Allergy & Precautions, NPO status , Patient's Chart, lab work & pertinent test results  Airway Mallampati: III  TM Distance: <3 FB Neck ROM: full    Dental  (+) Missing   Pulmonary asthma , sleep apnea , COPD, former smoker   Pulmonary exam normal        Cardiovascular hypertension, + CAD  Normal cardiovascular exam     Neuro/Psych  Neuromuscular disease    GI/Hepatic negative GI ROS, Neg liver ROS,neg GERD  ,,  Endo/Other  negative endocrine ROS    Renal/GU negative Renal ROS  negative genitourinary   Musculoskeletal   Abdominal   Peds  Hematology negative hematology ROS (+)   Anesthesia Other Findings Past Medical History: No date: Arthritis No date: Asthma No date: CAD (coronary artery disease), native coronary artery No date: COPD (chronic obstructive pulmonary disease) (HCC) No date: Depression No date: Gout due to renal impairment, left elbow No date: Morbid obesity (HCC) No date: OSA (obstructive sleep apnea) No date: Other and unspecified hyperlipidemia No date: Sleep apnea No date: Unspecified essential hypertension  Past Surgical History: 05/13/2020: COLONOSCOPY WITH PROPOFOL ; N/A     Comment:  Procedure: COLONOSCOPY WITH PROPOFOL ;  Surgeon:               Maryruth Ole DASEN, MD;  Location: [facility] ENDOSCOPY;                Service: Endoscopy;  Laterality: N/A; No date: NECK SURGERY 2007,2008: SPINE SURGERY     Comment:  2 ACDF  BMI    Body Mass Index: 52.42 kg/m      Reproductive/Obstetrics negative OB ROS                              Anesthesia Physical Anesthesia Plan  ASA: 3  Anesthesia Plan: General   Post-op Pain Management:    Induction: Intravenous  PONV Risk Score and Plan: Propofol  infusion and TIVA  Airway Management Planned: Natural Airway and  Nasal Cannula  Additional Equipment:   Intra-op Plan:   Post-operative Plan:   Informed Consent: I have reviewed the patients History and Physical, chart, labs and discussed the procedure including the risks, benefits and alternatives for the proposed anesthesia with the patient or authorized representative who has indicated his/her understanding and acceptance.     Dental Advisory Given  Plan Discussed with: Anesthesiologist, CRNA and Surgeon  Anesthesia Plan Comments: (Patient consented for risks of anesthesia including but not limited to:  - adverse reactions to medications - risk of airway placement if required - damage to eyes, teeth, lips or other oral mucosa - nerve damage due to positioning  - sore throat or hoarseness - Damage to heart, brain, nerves, lungs, other parts of body or loss of life  Patient voiced understanding and assent.)        Anesthesia Quick Evaluation

## 2024-02-24 NOTE — Op Note (Signed)
 [facility] Patient Name: Gary Trevino Procedure Date: 02/24/2024 1:34 PM MRN: [id number] Account #: [id number] Date of Birth: 1956-01-01 Admit Type: Outpatient Age: 68 Room: [facility] ENDO ROOM 3 Gender: Male Note Status: Finalized Instrument Name: Colon Scope [id number] Procedure:             Colonoscopy Indications:           Surveillance: Personal history of adenomatous polyps                         on last colonoscopy > 3 years ago Providers:             Ole Schick MD, MD Medicines:             Monitored Anesthesia Care Complications:         No immediate complications. Estimated blood loss:                         Minimal. Procedure:             Pre-Anesthesia Assessment:                        - Prior to the procedure, a History and Physical was                         performed, and patient medications and allergies were                         reviewed. The patient is competent. The risks and                         benefits of the procedure and the sedation options and                         risks were discussed with the patient. All questions                         were answered and informed consent was obtained.                         Patient identification and proposed procedure were                         verified by the physician, the nurse, the                         anesthesiologist, the anesthetist and the technician                         in the endoscopy suite. Mental Status Examination:                         alert and oriented. Airway Examination: normal                         oropharyngeal airway and neck mobility. Respiratory                         Examination: clear to auscultation. CV Examination:  normal. Prophylactic Antibiotics: The patient does not                         require prophylactic antibiotics. Prior                         Anticoagulants: The patient has taken no anticoagulant                          or antiplatelet agents. ASA Grade Assessment: III - A                         patient with severe systemic disease. After reviewing                         the risks and benefits, the patient was deemed in                         satisfactory condition to undergo the procedure. The                         anesthesia plan was to use monitored anesthesia care                         (MAC). Immediately prior to administration of                         medications, the patient was re-assessed for adequacy                         to receive sedatives. The heart rate, respiratory                         rate, oxygen saturations, blood pressure, adequacy of                         pulmonary ventilation, and response to care were                         monitored throughout the procedure. The physical                         status of the patient was re-assessed after the                         procedure.                        After obtaining informed consent, the colonoscope was                         passed under direct vision. Throughout the procedure,                         the patient's blood pressure, pulse, and oxygen                         saturations were monitored continuously. The  Colonoscope was introduced through the anus and                         advanced to the the terminal ileum, with                         identification of the appendiceal orifice and IC                         valve. The colonoscopy was performed without                         difficulty. The patient tolerated the procedure well.                         The quality of the bowel preparation was good. The                         terminal ileum, ileocecal valve, appendiceal orifice,                         and rectum were photographed. Findings:      The perianal and digital rectal examinations were normal.      The terminal ileum appeared normal.      A 2  mm polyp was found in the ascending colon. The polyp was sessile.       The polyp was removed with a jumbo cold forceps. Resection and retrieval       were complete. Estimated blood loss was minimal.      A few small-mouthed diverticula were found in the sigmoid colon.      Internal hemorrhoids were found during retroflexion. The hemorrhoids       were Grade I (internal hemorrhoids that do not prolapse).      The exam was otherwise without abnormality on direct and retroflexion       views. Impression:            - The examined portion of the ileum was normal.                        - One 2 mm polyp in the ascending colon, removed with                         a jumbo cold forceps. Resected and retrieved.                        - Diverticulosis in the sigmoid colon.                        - Internal hemorrhoids.                        - The examination was otherwise normal on direct and                         retroflexion views. Recommendation:        - Discharge patient to home.                        - Resume previous diet.                        -  Continue present medications.                        - Await pathology results.                        - Repeat colonoscopy in 5 years for surveillance.                        - Return to referring physician as previously                         scheduled. Procedure Code(s):     --- Professional ---                        [phone], Colonoscopy, flexible; with biopsy, single or                         multiple Diagnosis Code(s):     --- Professional ---                        Z86.010, Personal history of colonic polyps                        K64.0, First degree hemorrhoids                        D12.2, Benign neoplasm of ascending colon                        K57.30, Diverticulosis of large intestine without                         perforation or abscess without bleeding CPT copyright 2022 [employer]. All rights  reserved. The codes documented in this report are preliminary and upon coder review may  be revised to meet current compliance requirements. Ole Schick MD, MD 02/24/2024 2:18:27 PM Number of Addenda: 0 Note Initiated On: 02/24/2024 1:34 PM Scope Withdrawal Time: 0 hours 9 minutes 8 seconds  Total Procedure Duration: 0 hours 11 minutes 31 seconds  Estimated Blood Loss:  Estimated blood loss was minimal.      [facility]

## 2024-02-24 NOTE — Interval H&P Note (Signed)
 History and Physical Interval Note:  02/24/2024 1:40 PM  Gary W Enochs Sr.  has presented today for surgery, with the diagnosis of Z86.0100 (ICD-10-CM) - History of colon polyps.  The various methods of treatment have been discussed with the patient and family. After consideration of risks, benefits and other options for treatment, the patient has consented to  Procedure(s) with comments: COLONOSCOPY (N/A) - Diet controled CM as a surgical intervention.  The patient's history has been reviewed, patient examined, no change in status, stable for surgery.  I have reviewed the patient's chart and labs.  Questions were answered to the patient's satisfaction.     [name]  Ok to proceed with colonoscopy

## 2024-02-24 NOTE — H&P (Addendum)
 Outpatient short stay form Pre-procedure 02/24/2024  Gary ONEIDA Schick, MD  Primary Physician: Vannie Norleen KATHEE DOUGLAS, MD  Reason for visit:  Surveillance  History of present illness:    68 y/o gentleman with history of obesity, arthritis, and OSA here for surveillance colonoscopy. Last colonoscopy in 2021 with multiple small Ta's. No blood thinners. No family history of GI malignancies. No significant abdominal surgeries.    Current Facility-Administered Medications:    0.9 %  sodium chloride  infusion, , Intravenous, Continuous, Basheer Molchan, Gary ONEIDA, MD, Last Rate: 20 mL/hr at 02/24/24 1317, Continued from Pre-op at 02/24/24 1317  Medications Prior to Admission  Medication Sig Dispense Refill Last Dose/Taking   allopurinol (ZYLOPRIM) 300 MG tablet Take 300 mg by mouth daily.    02/23/2024   cholecalciferol (VITAMIN D) 1000 UNITS tablet Take 1,000 Units by mouth daily. Reported on 11/25/2015   Past Week   gabapentin  (NEURONTIN ) 100 MG capsule Take 1 capsule (100 mg total) by mouth 3 (three) times daily. 270 capsule 1 02/23/2024   losartan (COZAAR) 50 MG tablet Take 25 mg by mouth daily.    02/24/2024 at  6:00 AM   rosuvastatin (CRESTOR) 5 MG tablet Take 5 mg by mouth daily.   02/23/2024   tiZANidine  (ZANAFLEX ) 2 MG tablet TAKE 2 TABLETS (4 MG TOTAL) BY MOUTH EVERY 8 (EIGHT) HOURS AS NEEDED FOR MUSCLE SPASMS. 90 tablet 2 02/23/2024   venlafaxine  (EFFEXOR ) 75 MG tablet TAKE 1/2 TABLET (37.5 MG TOTAL) BY MOUTH 2 (TWO) TIMES DAILY. 90 tablet 0 02/24/2024 at  6:00 AM   albuterol (VENTOLIN HFA) 108 (90 Base) MCG/ACT inhaler Inhale 2 puffs into the lungs every 6 (six) hours as needed for wheezing.       aspirin 81 MG tablet Take 160 mg by mouth daily. (Patient not taking: Reported on 02/24/2024)   Not Taking   cefUROXime  (CEFTIN ) 250 MG tablet Take 1 tablet (250 mg total) by mouth 2 (two) times daily with a meal. (Patient not taking: Reported on 09/16/2021) 14 tablet 0    cefUROXime  (CEFTIN ) 250 MG tablet Take 1  tablet (250 mg total) by mouth 2 (two) times daily with a meal. (Patient not taking: Reported on 01/14/2016) 14 tablet 0    fluconazole  (DIFLUCAN ) 200 MG tablet Take 1 pill three times weekly. (Patient not taking: Reported on 02/24/2024) 12 tablet 0 Completed Course   ketoconazole  (NIZORAL ) 2 % shampoo Use as body wash at least three times per week 120 mL 11    mometasone (NASONEX) 50 MCG/ACT nasal spray Place 2 sprays into the nose daily.      simvastatin (ZOCOR) 20 MG tablet Reported on 11/25/2015 (Patient not taking: Reported on 02/24/2024)   Not Taking   traMADol  (ULTRAM ) 50 MG tablet Limit 1-2 tablets by mouth 2-4 times per day if tolerated 240 tablet 0      Allergies  Allergen Reactions   Codeine Itching     Past Medical History:  Diagnosis Date   Arthritis    Asthma    CAD (coronary artery disease), native coronary artery    COPD (chronic obstructive pulmonary disease) (HCC)    Depression    Gout due to renal impairment, left elbow    Morbid obesity (HCC)    OSA (obstructive sleep apnea)    Other and unspecified hyperlipidemia    Sleep apnea    Unspecified essential hypertension     Review of systems:  Otherwise negative.    Physical Exam  Gen: Alert, oriented.  Appears stated age.  HEENT: PERRLA. Lungs: No respiratory distress CV: RRR Abd: soft, benign, no masses Ext: No edema    Planned procedures: Proceed with colonoscopy. The patient understands the nature of the planned procedure, indications, risks, alternatives and potential complications including but not limited to bleeding, infection, perforation, damage to internal organs and possible oversedation/side effects from anesthesia. The patient agrees and gives consent to proceed.  Please refer to procedure notes for findings, recommendations and patient disposition/instructions.     Gary ONEIDA Schick, MD [facility]

## 2024-02-24 NOTE — Transfer of Care (Signed)
 Immediate Anesthesia Transfer of Care Note  Patient: Gary Trevino.  Procedure(s) Performed: COLONOSCOPY POLYPECTOMY, INTESTINE  Patient Location: Endoscopy Unit  Anesthesia Type:General  Level of Consciousness: awake  Airway & Oxygen Therapy: Patient Spontanous Breathing  Post-op Assessment: Report given to RN and Post -op Vital signs reviewed and stable  Post vital signs: Reviewed and stable  Last Vitals:  Vitals Value Taken Time  BP 115/78 02/24/24 14:17  Temp    Pulse 67 02/24/24 14:17  Resp 15 02/24/24 14:17  SpO2 96 % 02/24/24 14:17  Vitals shown include unfiled device data.  Last Pain:  Vitals:   02/24/24 1227  TempSrc: Temporal  PainSc: 0-No pain         Complications: There were no known notable events for this encounter.

## 2024-02-26 NOTE — Anesthesia Postprocedure Evaluation (Signed)
 Anesthesia Post Note  Patient: Gary Trevino.  Procedure(s) Performed: COLONOSCOPY POLYPECTOMY, INTESTINE  Patient location during evaluation: Endoscopy Anesthesia Type: General Level of consciousness: awake and alert Pain management: pain level controlled Vital Signs Assessment: post-procedure vital signs reviewed and stable Respiratory status: spontaneous breathing, nonlabored ventilation and respiratory function stable Cardiovascular status: blood pressure returned to baseline and stable Postop Assessment: no apparent nausea or vomiting Anesthetic complications: no   There were no known notable events for this encounter.   Last Vitals:  Vitals:   02/24/24 1423 02/24/24 1432  BP: 125/74 122/77  Pulse: 64 63  Resp:    Temp:    SpO2: 95% 95%    Last Pain:  Vitals:   02/24/24 1432  TempSrc:   PainSc: 0-No pain                 Fairy POUR Mikaella Escalona

## 2024-02-27 ENCOUNTER — Encounter: Payer: Self-pay | Admitting: Gastroenterology

## 2024-02-27 LAB — SURGICAL PATHOLOGY
